# Patient Record
Sex: Female | Born: 1950 | Race: Asian | Hispanic: No | Marital: Single | Smoking: Never smoker
Health system: Southern US, Community
[De-identification: ages and names within clinical notes are randomized; demographics above are authoritative.]

## PROBLEM LIST (undated history)

## (undated) DIAGNOSIS — I10 Essential (primary) hypertension: Secondary | ICD-10-CM

## (undated) DIAGNOSIS — M1990 Unspecified osteoarthritis, unspecified site: Secondary | ICD-10-CM

---

## 2015-10-14 ENCOUNTER — Emergency Department (HOSPITAL_COMMUNITY): Payer: BLUE CROSS/BLUE SHIELD

## 2015-10-14 ENCOUNTER — Emergency Department (HOSPITAL_COMMUNITY)
Admission: EM | Admit: 2015-10-14 | Discharge: 2015-10-14 | Disposition: A | Discharge status: Home / Self Care | Payer: BLUE CROSS/BLUE SHIELD | Attending: Emergency Medicine | Admitting: Emergency Medicine

## 2015-10-14 ENCOUNTER — Encounter (HOSPITAL_COMMUNITY): Payer: Self-pay | Admitting: Emergency Medicine

## 2015-10-14 DIAGNOSIS — R51 Headache: Secondary | ICD-10-CM | POA: Insufficient documentation

## 2015-10-14 DIAGNOSIS — I10 Essential (primary) hypertension: Secondary | ICD-10-CM | POA: Diagnosis not present

## 2015-10-14 DIAGNOSIS — R0602 Shortness of breath: Secondary | ICD-10-CM | POA: Insufficient documentation

## 2015-10-14 DIAGNOSIS — Z8739 Personal history of other diseases of the musculoskeletal system and connective tissue: Secondary | ICD-10-CM | POA: Insufficient documentation

## 2015-10-14 DIAGNOSIS — R531 Weakness: Secondary | ICD-10-CM | POA: Insufficient documentation

## 2015-10-14 DIAGNOSIS — R519 Headache, unspecified: Secondary | ICD-10-CM

## 2015-10-14 DIAGNOSIS — R002 Palpitations: Secondary | ICD-10-CM | POA: Insufficient documentation

## 2015-10-14 DIAGNOSIS — R079 Chest pain, unspecified: Secondary | ICD-10-CM | POA: Diagnosis not present

## 2015-10-14 HISTORY — DX: Unspecified osteoarthritis, unspecified site: M19.90

## 2015-10-14 HISTORY — DX: Essential (primary) hypertension: I10

## 2015-10-14 LAB — BASIC METABOLIC PANEL
Anion gap: 6 (ref 5–15)
BUN: 11 mg/dL (ref 6–20)
CALCIUM: 9.2 mg/dL (ref 8.9–10.3)
CO2: 26 mmol/L (ref 22–32)
CREATININE: 0.7 mg/dL (ref 0.44–1.00)
Chloride: 106 mmol/L (ref 101–111)
GFR calc non Af Amer: >60 mL/min (ref >60)
Glucose, Bld: 160 mg/dL — ABNORMAL HIGH (ref 65–99)
Potassium: 3.9 mmol/L (ref 3.5–5.1)
SODIUM: 138 mmol/L (ref 135–145)

## 2015-10-14 LAB — CBC
HCT: 38.6 % (ref 36.0–46.0)
Hemoglobin: 12.5 g/dL (ref 12.0–15.0)
MCH: 25 pg — ABNORMAL LOW (ref 26.0–34.0)
MCHC: 32.4 g/dL (ref 30.0–36.0)
MCV: 77.2 fL — AB (ref 78.0–100.0)
PLATELETS: 370 10*3/uL (ref 150–400)
RBC: 5 MIL/uL (ref 3.87–5.11)
RDW: 15 % (ref 11.5–15.5)
WBC: 9.7 10*3/uL (ref 4.0–10.5)

## 2015-10-14 LAB — I-STAT TROPONIN, ED: TROPONIN I, POC: 0 ng/mL (ref 0.00–0.08)

## 2015-10-14 NOTE — ED Notes (Signed)
Pt brought to ED by [facility] from home for c/o 5/10 left side cp and feeling heart raising, SOB, dizziness, HA on right side and generalized weakness, last seen well by son yesterday night. Pt is taking ODC medication for cold, SR on [facility] monitor, BP 151/85, HR 95, R 19, 95% RA. 324 mg ASA and 2 nitro sl given by [facility] PTA, pt is vietnamese speaker, son at the bedside helping to translate per pt request.

## 2015-10-14 NOTE — ED Provider Notes (Addendum)
By signing my name below, I, Freida Busman, attest that this documentation has been prepared under the direction and in the presence of Kristen N Ward, DO . Electronically Signed: Freida Busman, Scribe. 10/14/2015. 3:52 AM.  TIME SEEN: 3:45 AM  CHIEF COMPLAINT:  Chief Complaint  Patient presents with  . Chest Pain  . Headache  . Weakness    HPI:  HPI Comments:  Chelsea Schaefer is a 65 y.o. female with a history of HTN, who presents to the Emergency Department via EMS complaining of constant left sided and central CP which began yesterday (10/13/15) AM and worsened ~ 1900/2000 last night . Pt reports associated elevated BP with systolic pressure of190  Palpitations, and SOB. Her symptoms have resolved at this time. Son pt is on HTN meds but takes them when her BP is elevated. She denies nausea, vomiting, diarrhea, fever, and cough. She also denies h/o DVT/PE and smoking hx. No alleviating factors noted.Pain was not exertional or pleuritic. No diaphoresis. Pt is not a native [place] speaker; hx translated/obtained from son.   ROS: See HPI Constitutional: no fever  Eyes: no drainage  ENT: no runny nose   Cardiovascular:  chest pain  Resp:  SOB  GI: no vomiting GU: no dysuria Integumentary: no rash  Allergy: no hives  Musculoskeletal: no leg swelling  Neurological: no slurred speech ROS otherwise negative  PAST MEDICAL HISTORY/PAST SURGICAL HISTORY:  Past Medical History  Diagnosis Date  . Hypertension   . Arthritis     MEDICATIONS:  Prior to Admission medications   Not on File    ALLERGIES:  No Known Allergies  SOCIAL HISTORY:  Social History  Substance Use Topics  . Smoking status: Never Smoker   . Smokeless tobacco: Not on file  . Alcohol Use: No    FAMILY HISTORY: History reviewed. No pertinent family history.  EXAM: BP 131/76 mmHg  Pulse 82  Temp(Src) 98.1 F (36.7 C) (Oral)  Resp 16  Ht  (1.575 m)  Wt 140 lb (63.504 kg)  BMI 25.60 kg/m2  SpO2  96% CONSTITUTIONAL: Alert and oriented and responds appropriately to questions. Well-appearing; well-nourished HEAD: Normocephalic EYES: Conjunctivae clear, PERRL ENT: normal nose; no rhinorrhea; moist mucous membranes NECK: Supple, no meningismus, no LAD; no meningismus CARD: RRR; S1 and S2 appreciated; no murmurs, no clicks, no rubs, no gallops CHEST: Non-tender without crepitus, ecchymosis, or deformity RESP: Normal chest excursion without splinting or tachypnea; breath sounds clear and equal bilaterally; no wheezes, no rhonchi, no rales, no hypoxia or respiratory distress, speaking full sentences ABD/GI: Normal bowel sounds; non-distended; soft, non-tender, no rebound, no guarding, no peritoneal signs BACK:  The back appears normal and is non-tender to palpation, there is no CVA tenderness EXT: Normal ROM in all joints; non-tender to palpation; no edema; normal capillary refill; no cyanosis, no calf tenderness or swelling    SKIN: Normal color for age and race; warm; no rash NEURO: Moves all extremities equally, sensation to light touch intact diffusely, cranial nerves II through XII intact PSYCH: The patient's mood and manner are appropriate. Grooming and personal hygiene are appropriate.   EKG Interpretation  Date/Time:  Wednesday October 14 2015 00:10:27 EDT Ventricular Rate:  87 PR Interval:  169 QRS Duration: 81 QT Interval:  392 QTC Calculation: 472 R Axis:   4 Text Interpretation:  Sinus rhythm Left atrial enlargement Left ventricular hypertrophy Borderline T abnormalities, inferior leads No old tracing to compare Confirmed by WARD,  DO, KRISTEN [phone]) on 10/14/2015  1:12:24 AM       MEDICAL DECISION MAKING: Patient here with chest pain and shortness of breath that started yesterday morning and was constant throughout the day but is now gone. Son reports she was hypertensive during this episode and took one of her blood pressure tablets is now feeling better. No history of  stress test or cardiac catheterization. No history of PE or DVT. Labs have been unremarkable.  Troponin obtained at 2 AM is negative. Given symptoms were constant for several hours I do not feel she needs serial enzymes. Otherwise labs are unremarkable. Chest x-ray clear. EKG shows no ST elevation. She does have nonspecific ST changes in inferior leads with no old for comparison. We'll provide PCP as well as cardiology information. Discussed return precautions. I feel she is low risk enough that she can be discharged home. Her heart score is 293 (age, history of hypertension, nonspecific T-wave changes) with risk of MACE 0.9-1.7%.  Have recommended close outpatient follow-up. Patient and son are comfortable with this plan.     Doubt dissection or pulmonary embolus given she is pain-free and asymptomatic currently.   Son does report the patient did complain of pain around her eyes. No severe headache. This is now also completely resolved. No numbness, tingling or focal weakness. No vision changes. Doubt infectious etiology or intracranial hemorrhage. Do not feel she needs emergent imaging of her head.   I personally performed the services described in this documentation, which was scribed in my presence. The recorded information has been reviewed and is accurate.    Layla MawKristen N Ward, DO 10/14/15 0400  Kristen N Ward, DO 10/14/15 0400  Layla MawKristen N Ward, DO 10/14/15 [id number]

## 2015-10-14 NOTE — Discharge Instructions (Signed)
General Headache Without Cause °A headache is pain or discomfort felt around the head or neck area. The specific cause of a headache may not be found. There are many causes and types of headaches. A few common ones are: °· Tension headaches. °· Migraine headaches. °· Cluster headaches. °· Chronic daily headaches. °HOME CARE INSTRUCTIONS  °Watch your condition for any changes. Take these steps to help with your condition: °Managing Pain °· Take over-the-counter and prescription medicines only as told by your health care provider. °· Lie down in a dark, quiet room when you have a headache. °· If directed, apply ice to the head and neck area: °· Put ice in a plastic bag. °· Place a towel between your skin and the bag. °· Leave the ice on for 20 minutes, 2-3 times per day. °· Use a heating pad or hot shower to apply heat to the head and neck area as told by your health care provider. °· Keep lights dim if bright lights bother you or make your headaches worse. °Eating and Drinking °· Eat meals on a regular schedule. °· Limit alcohol use. °· Decrease the amount of caffeine you drink, or stop drinking caffeine. °General Instructions °· Keep all follow-up visits as told by your health care provider. This is important. °· Keep a headache journal to help find out what may trigger your headaches. For example, write down: °· What you eat and drink. °· How much sleep you get. °· Any change to your diet or medicines. °· Try massage or other relaxation techniques. °· Limit stress. °· Sit up straight, and do not tense your muscles. °· Do not use tobacco products, including cigarettes, chewing tobacco, or e-cigarettes. If you need help quitting, ask your health care provider. °· Exercise regularly as told by your health care provider. °· Sleep on a regular schedule. Get 7-9 hours of sleep, or the amount recommended by your health care provider. °SEEK MEDICAL CARE IF:  °· Your symptoms are not helped by medicine. °· You have a  headache that is different from the usual headache. °· You have nausea or you vomit. °· You have a fever. °SEEK IMMEDIATE MEDICAL CARE IF:  °· Your headache becomes severe. °· You have repeated vomiting. °· You have a stiff neck. °· You have a loss of vision. °· You have problems with speech. °· You have pain in the eye or ear. °· You have muscular weakness or loss of muscle control. °· You lose your balance or have trouble walking. °· You feel faint or pass out. °· You have confusion. °  °This information is not intended to replace advice given to you by your health care provider. Make sure you discuss any questions you have with your health care provider. °  °Document Released: 07/11/2005 Document Revised: 04/01/2015 Document Reviewed: 11/03/2014 °Elsevier Interactive Patient Education ©2016 [employer] °Hypertension °Hypertension, commonly called high blood pressure, is when the force of blood pumping through your arteries is too strong. Your arteries are the blood vessels that carry blood from your heart throughout your body. A blood pressure reading consists of a higher number over a lower number, such as 110/72. The higher number (systolic) is the pressure inside your arteries when your heart pumps. The lower number (diastolic) is the pressure inside your arteries when your heart relaxes. Ideally you want your blood pressure below 120/80. °Hypertension forces your heart to work harder to pump blood. Your arteries may become narrow or stiff. Having untreated or uncontrolled   hypertension can cause heart attack, stroke, kidney disease, and other problems. °RISK FACTORS °Some risk factors for high blood pressure are controllable. Others are not.  °Risk factors you cannot control include:  °· Race. You may be at higher risk if you are African American. °· Age. Risk increases with age. °· Gender. Men are at higher risk than women before age 45 years. After age 65, women are at higher risk than men. °Risk factors  you can control include: °· Not getting enough exercise or physical activity. °· Being overweight. °· Getting too much fat, sugar, calories, or salt in your diet. °· Drinking too much alcohol. °SIGNS AND SYMPTOMS °Hypertension does not usually cause signs or symptoms. Extremely high blood pressure (hypertensive crisis) may cause headache, anxiety, shortness of breath, and nosebleed. °DIAGNOSIS °To check if you have hypertension, your health care provider will measure your blood pressure while you are seated, with your arm held at the level of your heart. It should be measured at least twice using the same arm. Certain conditions can cause a difference in blood pressure between your right and left arms. A blood pressure reading that is higher than normal on one occasion does not mean that you need treatment. If it is not clear whether you have high blood pressure, you may be asked to return on a different day to have your blood pressure checked again. Or, you may be asked to monitor your blood pressure at home for 1 or more weeks. °TREATMENT °Treating high blood pressure includes making lifestyle changes and possibly taking medicine. Living a healthy lifestyle can help lower high blood pressure. You may need to change some of your habits. °Lifestyle changes may include: °· Following the DASH diet. This diet is high in fruits, vegetables, and whole grains. It is low in salt, red meat, and added sugars. °· Keep your sodium intake below 2,300 mg per day. °· Getting at least 30-45 minutes of aerobic exercise at least 4 times per week. °· Losing weight if necessary. °· Not smoking. °· Limiting alcoholic beverages. °· Learning ways to reduce stress. °Your health care provider may prescribe medicine if lifestyle changes are not enough to get your blood pressure under control, and if one of the following is true: °· You are 18-59 years of age and your systolic blood pressure is above 140. °· You are 60 years of age or older,  and your systolic blood pressure is above 150. °· Your diastolic blood pressure is above 90. °· You have diabetes, and your systolic blood pressure is over 140 or your diastolic blood pressure is over 90. °· You have kidney disease and your blood pressure is above 140/90. °· You have heart disease and your blood pressure is above 140/90. °Your personal target blood pressure may vary depending on your medical conditions, your age, and other factors. °HOME CARE INSTRUCTIONS °· Have your blood pressure rechecked as directed by your health care provider.   °· Take medicines only as directed by your health care provider. Follow the directions carefully. Blood pressure medicines must be taken as prescribed. The medicine does not work as well when you skip doses. Skipping doses also puts you at risk for problems. °· Do not smoke.   °· Monitor your blood pressure at home as directed by your health care provider.  °SEEK MEDICAL CARE IF:  °· You think you are having a reaction to medicines taken. °· You have recurrent headaches or feel dizzy. °· You have swelling in your ankles. °·   You have trouble with your vision. °SEEK IMMEDIATE MEDICAL CARE IF: °· You develop a severe headache or confusion. °· You have unusual weakness, numbness, or feel faint. °· You have severe chest or abdominal pain. °· You vomit repeatedly. °· You have trouble breathing. °MAKE SURE YOU:  °· Understand these instructions. °· Will watch your condition. °· Will get help right away if you are not doing well or get worse. °  °This information is not intended to replace advice given to you by your health care provider. Make sure you discuss any questions you have with your health care provider. °  °Document Released: 07/11/2005 Document Revised: 11/25/2014 Document Reviewed: 05/03/2013 °Elsevier Interactive Patient Education ©2016 [employer] Inc[employer] °Nonspecific Chest Pain  °Chest pain can be caused by many different conditions. There is always a chance that  your pain could be related to something serious, such as a heart attack or a blood clot in your lungs. Chest pain can also be caused by conditions that are not life-threatening. If you have chest pain, it is very important to follow up with your health care provider. °CAUSES  °Chest pain can be caused by: °· Heartburn. °· Pneumonia or bronchitis. °· Anxiety or stress. °· Inflammation around your heart (pericarditis) or lung (pleuritis or pleurisy). °· A blood clot in your lung. °· A collapsed lung (pneumothorax). It can develop suddenly on its own (spontaneous pneumothorax) or from trauma to the chest. °· Shingles infection (varicella-zoster virus). °· Heart attack. °· Damage to the bones, muscles, and cartilage that make up your chest wall. This can include: °¨ Bruised bones due to injury. °¨ Strained muscles or cartilage due to frequent or repeated coughing or overwork. °¨ Fracture to one or more ribs. °¨ Sore cartilage due to inflammation (costochondritis). °RISK FACTORS  °Risk factors for chest pain may include: °· Activities that increase your risk for trauma or injury to your chest. °· Respiratory infections or conditions that cause frequent coughing. °· Medical conditions or overeating that can cause heartburn. °· Heart disease or family history of heart disease. °· Conditions or health behaviors that increase your risk of developing a blood clot. °· Having had chicken pox (varicella zoster). °SIGNS AND SYMPTOMS °Chest pain can feel like: °· Burning or tingling on the surface of your chest or deep in your chest. °· Crushing, pressure, aching, or squeezing pain. °· Dull or sharp pain that is worse when you move, cough, or take a deep breath. °· Pain that is also felt in your back, neck, shoulder, or arm, or pain that spreads to any of these areas. °Your chest pain may come and go, or it may stay constant. °DIAGNOSIS °Lab tests or other studies may be needed to find the cause of your pain. Your health care  provider may have you take a test called an ambulatory ECG (electrocardiogram). An ECG records your heartbeat patterns at the time the test is performed. You may also have other tests, such as: °· Transthoracic echocardiogram (TTE). During echocardiography, sound waves are used to create a picture of all of the heart structures and to look at how blood flows through your heart. °· Transesophageal echocardiogram (TEE). This is a more advanced imaging test that obtains images from inside your body. It allows your health care provider to see your heart in finer detail. °· Cardiac monitoring. This allows your health care provider to monitor your heart rate and rhythm in real time. °· Holter monitor. This is a portable device that records your   heartbeat and can help to diagnose abnormal heartbeats. It allows your health care provider to track your heart activity for several days, if needed. °· Stress tests. These can be done through exercise or by taking medicine that makes your heart beat more quickly. °· Blood tests. °· Imaging tests. °TREATMENT  °Your treatment depends on what is causing your chest pain. Treatment may include: °· Medicines. These may include: °¨ Acid blockers for heartburn. °¨ Anti-inflammatory medicine. °¨ Pain medicine for inflammatory conditions. °¨ Antibiotic medicine, if an infection is present. °¨ Medicines to dissolve blood clots. °¨ Medicines to treat coronary artery disease. °· Supportive care for conditions that do not require medicines. This may include: °¨ Resting. °¨ Applying heat or cold packs to injured areas. °¨ Limiting activities until pain decreases. °HOME CARE INSTRUCTIONS °· If you were prescribed an antibiotic medicine, finish it all even if you start to feel better. °· Avoid any activities that bring on chest pain. °· Do not use any tobacco products, including cigarettes, chewing tobacco, or electronic cigarettes. If you need help quitting, ask your health care provider. °· Do  not drink alcohol. °· Take medicines only as directed by your health care provider. °· Keep all follow-up visits as directed by your health care provider. This is important. This includes any further testing if your chest pain does not go away. °· If heartburn is the cause for your chest pain, you may be told to keep your head raised (elevated) while sleeping. This reduces the chance that acid will go from your stomach into your esophagus. °· Make lifestyle changes as directed by your health care provider. These may include: °¨ Getting regular exercise. Ask your health care provider to suggest some activities that are safe for you. °¨ Eating a heart-healthy diet. A registered dietitian can help you to learn healthy eating options. °¨ Maintaining a healthy weight. °¨ Managing diabetes, if necessary. °¨ Reducing stress. °SEEK MEDICAL CARE IF: °· Your chest pain does not go away after treatment. °· You have a rash with blisters on your chest. °· You have a fever. °SEEK IMMEDIATE MEDICAL CARE IF:  °· Your chest pain is worse. °· You have an increasing cough, or you cough up blood. °· You have severe abdominal pain. °· You have severe weakness. °· You faint. °· You have chills. °· You have sudden, unexplained chest discomfort. °· You have sudden, unexplained discomfort in your arms, back, neck, or jaw. °· You have shortness of breath at any time. °· You suddenly start to sweat, or your skin gets clammy. °· You feel nauseous or you vomit. °· You suddenly feel light-headed or dizzy. °· Your heart begins to beat quickly, or it feels like it is skipping beats. °These symptoms may represent a serious problem that is an emergency. Do not wait to see if the symptoms will go away. Get medical help right away. Call your local emergency services (911 in the [place] Do not drive yourself to the hospital. °  °This information is not intended to replace advice given to you by your health care provider. Make sure you discuss any  questions you have with your health care provider. °  °Document Released: 04/20/2005 Document Revised: 08/01/2014 Document Reviewed: 02/14/2014 °Elsevier Interactive Patient Education ©2016 [employer] Inc. ° °

## 2016-09-16 ENCOUNTER — Inpatient Hospital Stay (HOSPITAL_COMMUNITY)
Admission: EM | Admit: 2016-09-16 | Discharge: 2016-10-23 | DRG: 064 | Disposition: E | Payer: Medicare Other | Attending: Internal Medicine | Admitting: Internal Medicine

## 2016-09-16 ENCOUNTER — Encounter (HOSPITAL_COMMUNITY): Payer: Self-pay | Admitting: Emergency Medicine

## 2016-09-16 ENCOUNTER — Emergency Department (HOSPITAL_COMMUNITY): Payer: Medicare Other

## 2016-09-16 ENCOUNTER — Inpatient Hospital Stay (HOSPITAL_COMMUNITY): Payer: Medicare Other

## 2016-09-16 DIAGNOSIS — G9349 Other encephalopathy: Secondary | ICD-10-CM | POA: Diagnosis present

## 2016-09-16 DIAGNOSIS — R293 Abnormal posture: Secondary | ICD-10-CM

## 2016-09-16 DIAGNOSIS — I6789 Other cerebrovascular disease: Secondary | ICD-10-CM | POA: Diagnosis not present

## 2016-09-16 DIAGNOSIS — I959 Hypotension, unspecified: Secondary | ICD-10-CM | POA: Diagnosis not present

## 2016-09-16 DIAGNOSIS — Z515 Encounter for palliative care: Secondary | ICD-10-CM | POA: Diagnosis not present

## 2016-09-16 DIAGNOSIS — G253 Myoclonus: Secondary | ICD-10-CM | POA: Diagnosis not present

## 2016-09-16 DIAGNOSIS — Z9289 Personal history of other medical treatment: Secondary | ICD-10-CM

## 2016-09-16 DIAGNOSIS — R402113 Coma scale, eyes open, never, at hospital admission: Secondary | ICD-10-CM | POA: Diagnosis not present

## 2016-09-16 DIAGNOSIS — J690 Pneumonitis due to inhalation of food and vomit: Secondary | ICD-10-CM | POA: Diagnosis not present

## 2016-09-16 DIAGNOSIS — I10 Essential (primary) hypertension: Secondary | ICD-10-CM | POA: Diagnosis not present

## 2016-09-16 DIAGNOSIS — R402122 Coma scale, eyes open, to pain, at arrival to emergency department: Secondary | ICD-10-CM | POA: Diagnosis present

## 2016-09-16 DIAGNOSIS — I613 Nontraumatic intracerebral hemorrhage in brain stem: Secondary | ICD-10-CM | POA: Diagnosis present

## 2016-09-16 DIAGNOSIS — R739 Hyperglycemia, unspecified: Secondary | ICD-10-CM | POA: Diagnosis not present

## 2016-09-16 DIAGNOSIS — R402431 Glasgow coma scale score 3-8, in the field [EMT or ambulance]: Secondary | ICD-10-CM | POA: Diagnosis not present

## 2016-09-16 DIAGNOSIS — E876 Hypokalemia: Secondary | ICD-10-CM | POA: Diagnosis present

## 2016-09-16 DIAGNOSIS — R29729 NIHSS score 29: Secondary | ICD-10-CM | POA: Diagnosis not present

## 2016-09-16 DIAGNOSIS — J9600 Acute respiratory failure, unspecified whether with hypoxia or hypercapnia: Secondary | ICD-10-CM

## 2016-09-16 DIAGNOSIS — Y95 Nosocomial condition: Secondary | ICD-10-CM | POA: Diagnosis not present

## 2016-09-16 DIAGNOSIS — R403 Persistent vegetative state: Secondary | ICD-10-CM | POA: Diagnosis not present

## 2016-09-16 DIAGNOSIS — E785 Hyperlipidemia, unspecified: Secondary | ICD-10-CM | POA: Diagnosis present

## 2016-09-16 DIAGNOSIS — K5900 Constipation, unspecified: Secondary | ICD-10-CM | POA: Diagnosis not present

## 2016-09-16 DIAGNOSIS — R402342 Coma scale, best motor response, flexion withdrawal, at arrival to emergency department: Secondary | ICD-10-CM | POA: Diagnosis present

## 2016-09-16 DIAGNOSIS — M1990 Unspecified osteoarthritis, unspecified site: Secondary | ICD-10-CM | POA: Diagnosis not present

## 2016-09-16 DIAGNOSIS — I619 Nontraumatic intracerebral hemorrhage, unspecified: Secondary | ICD-10-CM

## 2016-09-16 DIAGNOSIS — R402212 Coma scale, best verbal response, none, at arrival to emergency department: Secondary | ICD-10-CM | POA: Diagnosis present

## 2016-09-16 DIAGNOSIS — J9690 Respiratory failure, unspecified, unspecified whether with hypoxia or hypercapnia: Secondary | ICD-10-CM

## 2016-09-16 DIAGNOSIS — J9811 Atelectasis: Secondary | ICD-10-CM | POA: Diagnosis present

## 2016-09-16 DIAGNOSIS — J9601 Acute respiratory failure with hypoxia: Secondary | ICD-10-CM | POA: Diagnosis present

## 2016-09-16 DIAGNOSIS — Z978 Presence of other specified devices: Secondary | ICD-10-CM

## 2016-09-16 DIAGNOSIS — I161 Hypertensive emergency: Secondary | ICD-10-CM | POA: Diagnosis present

## 2016-09-16 DIAGNOSIS — R6510 Systemic inflammatory response syndrome (SIRS) of non-infectious origin without acute organ dysfunction: Secondary | ICD-10-CM | POA: Diagnosis not present

## 2016-09-16 DIAGNOSIS — R509 Fever, unspecified: Secondary | ICD-10-CM

## 2016-09-16 DIAGNOSIS — D72829 Elevated white blood cell count, unspecified: Secondary | ICD-10-CM

## 2016-09-16 DIAGNOSIS — Z66 Do not resuscitate: Secondary | ICD-10-CM | POA: Diagnosis not present

## 2016-09-16 LAB — DIFFERENTIAL
Basophils Absolute: 0 10*3/uL (ref 0.0–0.1)
Basophils Relative: 0 %
Eosinophils Absolute: 0 10*3/uL (ref 0.0–0.7)
Eosinophils Relative: 0 %
Lymphocytes Relative: 56 %
Lymphs Abs: 9.7 10*3/uL — ABNORMAL HIGH (ref 0.7–4.0)
Monocytes Absolute: 0.7 10*3/uL (ref 0.1–1.0)
Monocytes Relative: 4 %
Neutro Abs: 6.9 10*3/uL (ref 1.7–7.7)
Neutrophils Relative %: 40 %

## 2016-09-16 LAB — COMPREHENSIVE METABOLIC PANEL
ALT: 23 U/L (ref 14–54)
AST: 26 U/L (ref 15–41)
Albumin: 3.6 g/dL (ref 3.5–5.0)
Alkaline Phosphatase: 62 U/L (ref 38–126)
Anion gap: 14 (ref 5–15)
BUN: 14 mg/dL (ref 6–20)
CO2: 20 mmol/L — ABNORMAL LOW (ref 22–32)
Calcium: 9.6 mg/dL (ref 8.9–10.3)
Chloride: 105 mmol/L (ref 101–111)
Creatinine, Ser: 0.71 mg/dL (ref 0.44–1.00)
GFR calc Af Amer: >60 mL/min (ref >60)
GFR calc non Af Amer: >60 mL/min (ref >60)
Glucose, Bld: 138 mg/dL — ABNORMAL HIGH (ref 65–99)
Potassium: 4.4 mmol/L (ref 3.5–5.1)
Sodium: 139 mmol/L (ref 135–145)
Total Bilirubin: 0.7 mg/dL (ref 0.3–1.2)
Total Protein: 7.7 g/dL (ref 6.5–8.1)

## 2016-09-16 LAB — RAPID URINE DRUG SCREEN, HOSP PERFORMED
AMPHETAMINES: NOT DETECTED
Barbiturates: NOT DETECTED
Benzodiazepines: NOT DETECTED
Cocaine: NOT DETECTED
Opiates: NOT DETECTED
TETRAHYDROCANNABINOL: NOT DETECTED

## 2016-09-16 LAB — I-STAT CHEM 8, ED
BUN: 17 mg/dL (ref 6–20)
Calcium, Ion: 1.07 mmol/L — ABNORMAL LOW (ref 1.15–1.40)
Chloride: 108 mmol/L (ref 101–111)
Creatinine, Ser: 0.7 mg/dL (ref 0.44–1.00)
Glucose, Bld: 141 mg/dL — ABNORMAL HIGH (ref 65–99)
HCT: 45 % (ref 36.0–46.0)
Hemoglobin: 15.3 g/dL — ABNORMAL HIGH (ref 12.0–15.0)
Potassium: 4.3 mmol/L (ref 3.5–5.1)
Sodium: 141 mmol/L (ref 135–145)
TCO2: 26 mmol/L (ref 0–100)

## 2016-09-16 LAB — POCT I-STAT 3, ART BLOOD GAS (G3+)
ACID-BASE DEFICIT: 1 mmol/L (ref 0.0–2.0)
Bicarbonate: 23.1 mmol/L (ref 20.0–28.0)
O2 Saturation: 98 %
PH ART: 7.409 (ref 7.350–7.450)
Patient temperature: 98.6
TCO2: 24 mmol/L (ref 0–100)
pCO2 arterial: 36.5 mmHg (ref 32.0–48.0)
pO2, Arterial: 102 mmHg (ref 83.0–108.0)

## 2016-09-16 LAB — I-STAT TROPONIN, ED: Troponin i, poc: 0 ng/mL (ref 0.00–0.08)

## 2016-09-16 LAB — CBC
HCT: 42.1 % (ref 36.0–46.0)
Hemoglobin: 13.7 g/dL (ref 12.0–15.0)
MCH: 25.5 pg — ABNORMAL LOW (ref 26.0–34.0)
MCHC: 32.5 g/dL (ref 30.0–36.0)
MCV: 78.3 fL (ref 78.0–100.0)
Platelets: 358 10*3/uL (ref 150–400)
RBC: 5.38 MIL/uL — ABNORMAL HIGH (ref 3.87–5.11)
RDW: 15.3 % (ref 11.5–15.5)
WBC: 17.3 10*3/uL — ABNORMAL HIGH (ref 4.0–10.5)

## 2016-09-16 LAB — MRSA PCR SCREENING: MRSA BY PCR: NEGATIVE

## 2016-09-16 LAB — PROTIME-INR
INR: 0.98
INR: 0.95
PROTHROMBIN TIME: 12.7 s (ref 11.4–15.2)
Prothrombin Time: 13 seconds (ref 11.4–15.2)

## 2016-09-16 LAB — LIPID PANEL
CHOL/HDL RATIO: 8.7 ratio
Cholesterol: 323 mg/dL — ABNORMAL HIGH (ref 0–200)
HDL: 37 mg/dL — ABNORMAL LOW (ref >40)
LDL CALC: 241 mg/dL — AB (ref 0–99)
TRIGLYCERIDES: 226 mg/dL — AB (ref <150)
VLDL: 45 mg/dL — ABNORMAL HIGH (ref 0–40)

## 2016-09-16 LAB — TRIGLYCERIDES: Triglycerides: 227 mg/dL — ABNORMAL HIGH (ref <150)

## 2016-09-16 LAB — APTT
aPTT: 27 seconds (ref 24–36)
aPTT: 22 seconds — ABNORMAL LOW (ref 24–36)

## 2016-09-16 LAB — CBG MONITORING, ED: Glucose-Capillary: 138 mg/dL — ABNORMAL HIGH (ref 65–99)

## 2016-09-16 MED ORDER — CLEVIDIPINE BUTYRATE 0.5 MG/ML IV EMUL: 0.0000 mg/h | INTRAVENOUS | Status: DC

## 2016-09-16 MED ORDER — CLEVIDIPINE BUTYRATE 0.5 MG/ML IV EMUL
0.0000 mg/h | INTRAVENOUS | Status: DC
  Administered 2016-09-16: 13 mg/h via INTRAVENOUS
  Administered 2016-09-16: 16 mg/h via INTRAVENOUS
  Administered 2016-09-16: 3 mg/h via INTRAVENOUS
  Administered 2016-09-17: 18 mg/h via INTRAVENOUS
  Administered 2016-09-17 (×3): 9 mg/h via INTRAVENOUS
  Administered 2016-09-17: 18 mg/h via INTRAVENOUS
  Administered 2016-09-17: 14 mg/h via INTRAVENOUS
  Administered 2016-09-17: 9 mg/h via INTRAVENOUS
  Administered 2016-09-17: 16 mg/h via INTRAVENOUS
  Administered 2016-09-17: 14 mg/h via INTRAVENOUS
  Administered 2016-09-17: 18 mg/h via INTRAVENOUS
  Administered 2016-09-18 (×2): 8 mg/h via INTRAVENOUS
  Administered 2016-09-18 – 2016-09-19 (×2): 4 mg/h via INTRAVENOUS
  Administered 2016-09-19: 7 mg/h via INTRAVENOUS
  Administered 2016-09-19 (×2): 6 mg/h via INTRAVENOUS
  Administered 2016-09-20: 10 mg/h via INTRAVENOUS
  Administered 2016-09-20: 18 mg/h via INTRAVENOUS
  Administered 2016-09-20: 14 mg/h via INTRAVENOUS
  Administered 2016-09-20: 18 mg/h via INTRAVENOUS
  Administered 2016-09-20: 8 mg/h via INTRAVENOUS
  Filled 2016-09-16: qty 50
  Filled 2016-09-16: qty 100
  Filled 2016-09-16 (×11): qty 50
  Filled 2016-09-16: qty 100
  Filled 2016-09-16 (×9): qty 50

## 2016-09-16 MED ORDER — PANTOPRAZOLE SODIUM 40 MG IV SOLR
40.0000 mg | Freq: Every day | INTRAVENOUS | Status: DC
  Administered 2016-09-16: 40 mg via INTRAVENOUS
  Filled 2016-09-16: qty 40

## 2016-09-16 MED ORDER — PROPOFOL 1000 MG/100ML IV EMUL
INTRAVENOUS | Status: AC
  Filled 2016-09-16: qty 100

## 2016-09-16 MED ORDER — ACETAMINOPHEN 160 MG/5ML PO SOLN
650.0000 mg | ORAL | Status: DC | PRN
  Administered 2016-09-16 – 2016-09-24 (×21): 650 mg
  Filled 2016-09-16 (×21): qty 20.3

## 2016-09-16 MED ORDER — ORAL CARE MOUTH RINSE
15.0000 mL | Freq: Four times a day (QID) | OROMUCOSAL | Status: DC
  Administered 2016-09-16 – 2016-09-18 (×9): 15 mL via OROMUCOSAL

## 2016-09-16 MED ORDER — SODIUM CHLORIDE 0.9 % IV SOLN
INTRAVENOUS | Status: DC
  Administered 2016-09-16 – 2016-09-25 (×11): via INTRAVENOUS

## 2016-09-16 MED ORDER — CHLORHEXIDINE GLUCONATE 0.12% ORAL RINSE (MEDLINE KIT)
15.0000 mL | Freq: Two times a day (BID) | OROMUCOSAL | Status: DC
  Administered 2016-09-16 – 2016-09-26 (×20): 15 mL via OROMUCOSAL

## 2016-09-16 MED ORDER — FENTANYL CITRATE (PF) 100 MCG/2ML IJ SOLN: 50.0000 ug | INTRAMUSCULAR | Status: DC | PRN

## 2016-09-16 MED ORDER — PROPOFOL 1000 MG/100ML IV EMUL: 0.0000 ug/kg/min | INTRAVENOUS | Status: DC

## 2016-09-16 MED ORDER — FENTANYL CITRATE (PF) 100 MCG/2ML IJ SOLN
50.0000 ug | INTRAMUSCULAR | Status: DC | PRN
  Administered 2016-09-23 (×2): 50 ug via INTRAVENOUS
  Filled 2016-09-16 (×2): qty 2

## 2016-09-16 MED ORDER — ACETAMINOPHEN 650 MG RE SUPP
650.0000 mg | RECTAL | Status: DC | PRN
  Administered 2016-09-21: 650 mg via RECTAL
  Filled 2016-09-16: qty 1

## 2016-09-16 MED ORDER — STROKE: EARLY STAGES OF RECOVERY BOOK
Freq: Once | Status: AC
  Administered 2016-09-16: 15:00:00
  Filled 2016-09-16: qty 1

## 2016-09-16 MED ORDER — ROCURONIUM BROMIDE 50 MG/5ML IV SOLN
INTRAVENOUS | Status: DC | PRN
  Administered 2016-09-16: 60 mg via INTRAVENOUS

## 2016-09-16 MED ORDER — ACETAMINOPHEN 325 MG PO TABS
650.0000 mg | ORAL_TABLET | ORAL | Status: DC | PRN
  Administered 2016-09-21 – 2016-09-23 (×4): 650 mg via ORAL
  Filled 2016-09-16 (×5): qty 2

## 2016-09-16 MED ORDER — SENNOSIDES-DOCUSATE SODIUM 8.6-50 MG PO TABS
1.0000 | ORAL_TABLET | Freq: Two times a day (BID) | ORAL | Status: DC
  Administered 2016-09-16 – 2016-09-21 (×10): 1 via ORAL
  Filled 2016-09-16 (×11): qty 1

## 2016-09-16 MED ORDER — ETOMIDATE 2 MG/ML IV SOLN
INTRAVENOUS | Status: DC | PRN
  Administered 2016-09-16: 20 mg via INTRAVENOUS

## 2016-09-16 MED ORDER — CLEVIDIPINE BUTYRATE 0.5 MG/ML IV EMUL
0.0000 mg/h | INTRAVENOUS | Status: DC
  Administered 2016-09-16: 2 mg/h via INTRAVENOUS

## 2016-09-16 MED ORDER — PROPOFOL 1000 MG/100ML IV EMUL
0.0000 ug/kg/min | INTRAVENOUS | Status: DC
  Administered 2016-09-16: 5 ug/kg/min via INTRAVENOUS

## 2016-09-16 NOTE — Progress Notes (Signed)
Patient's son approached Stroke Response RN and ED RN about patient's drivers license (DL) having a heart for organ donation on it. The son states the heart was supposed to be removed from her DL upon last renew, however d/t translation issues, the heart remained. Son and Husband both state they and the patient would NOT want organ donation. They further stated they plan to take her back to her home country after she passes.

## 2016-09-16 NOTE — Progress Notes (Signed)
eLink Physician-Brief Progress Note Patient Name: Donella Stadeu Thi Bartolini DOB: 29-Dec-1950 MRN: [id number]   Date of Service  2016/09/21  HPI/Events of Note  Fever - Temp = 102.3 F. S/p Tylenol dose.   eICU Interventions  Will order cooling blanket.      Intervention Category Intermediate Interventions: Other:  Lenell AntuSommer,Steven Eugene 2016/09/21, 8:13 PM

## 2016-09-16 NOTE — Progress Notes (Signed)
Nutrition Brief Note  Chart reviewed. Pt in coma with very poor prognosis.  No nutrition interventions warranted at this time.  Please consult as needed.   Maureen ChattersKatie Anuoluwapo Mefferd, RD, LDN Pager #: [phone] After-Hours Pager #: [phone]

## 2016-09-16 NOTE — ED Notes (Signed)
RT at bedside to pull back ETT 2 cm.

## 2016-09-16 NOTE — H&P (Signed)
PULMONARY / CRITICAL CARE MEDICINE   Name: Chelsea Schaefer MRN: [id number] DOB: 08-03-1950    ADMISSION DATE:  08/27/2016 CONSULTATION DATE:  2/23  REFERRING MD:  Juleen China   CHIEF COMPLAINT:  ICH   HISTORY OF PRESENT ILLNESS:   This is a 66 year old female w/ no sig history. Was in usual health until am of admit. Pt awoke around 0700 went to RR. About 0730 she was found unresponsive on the floor. On arrival to ER she would wd to pain. Was hypertensive w/ SBP in 170-180s. CT head + for pontine ICH. PCCM asked to admit.  PAST MEDICAL HISTORY :  She  has a past medical history of Arthritis and Hypertension.  PAST SURGICAL HISTORY: She  has no past surgical history on file.  No Known Allergies  No current facility-administered medications on file prior to encounter.    No current outpatient prescriptions on file prior to encounter.    FAMILY HISTORY:  Her has no family status information on file.    SOCIAL HISTORY: She  reports that she has never smoked. She has never used smokeless tobacco. She reports that she does not drink alcohol or use drugs.  REVIEW OF SYSTEMS:   Unable   SUBJECTIVE:  Unresponsive on vent   VITAL SIGNS: BP 140/87   Pulse 114   Temp 97.1 F (36.2 C)   Resp 25   Ht 5\' 4"  (1.626 m)   Wt 142 lb 10.2 oz (64.7 kg)   SpO2 100%   BMI 24.48 kg/m   HEMODYNAMICS:    VENTILATOR SETTINGS: Vent Mode: PRVC FiO2 (%):  [40 %] 40 % Set Rate:  [15 bmp] 15 bmp Vt Set:  [400 mL] 400 mL PEEP:  [5 cmH20] 5 cmH20 Plateau Pressure:  [18 cmH20] 18 cmH20  INTAKE / OUTPUT: No intake/output data recorded.  PHYSICAL EXAMINATION: General:  66 year old female, minimally responsive on vent  Neuro:  No gag or cough, corneal's are absent. Flaccid and does not move  HEENT:  Orally intubated. MMM, no JVD.  Cardiovascular:  RRR no MRG Lungs:  Clear w/ equal rise  Abdomen:  Soft, not tender  Musculoskeletal:  Equal bulk Skin:  Warm and dry  LABS:  BMET  Recent  Labs Lab 08/28/2016 0806 09/05/2016 0812  NA 139 141  K 4.4 4.3  CL 105 108  CO2 20*  --   BUN 14 17  CREATININE 0.71 0.70  GLUCOSE 138* 141*    Electrolytes  Recent Labs Lab 08/25/2016 0806  CALCIUM 9.6    CBC  Recent Labs Lab 09/08/2016 0806 09/17/2016 0812  WBC 17.3*  --   HGB 13.7 15.3*  HCT 42.1 45.0  PLT 358  --     Coag's  Recent Labs Lab 08/26/2016 0806  APTT 27  INR 0.98    Sepsis Markers No results for input(s): LATICACIDVEN, PROCALCITON, O2SATVEN in the last 168 hours.  ABG No results for input(s): PHART, PCO2ART, PO2ART in the last 168 hours.  Liver Enzymes  Recent Labs Lab 09/04/2016 0806  AST 26  ALT 23  ALKPHOS 62  BILITOT 0.7  ALBUMIN 3.6    Cardiac Enzymes No results for input(s): TROPONINI, PROBNP in the last 168 hours.  Glucose  Recent Labs Lab 09/01/2016 0828  GLUCAP 138*    Imaging Dg Chest Portable 1 View  Result Date: 08/26/2016 CLINICAL DATA:  Unconscious.  Endotracheal tube placement. EXAM: PORTABLE CHEST 1 VIEW COMPARISON:  10/14/2015 FINDINGS: Endotracheal tube  tip is at the orifice of the right mainstem bronchus. There is left basilar opacity with volume loss elevating the diaphragm and stomach. An orogastric tube reaches the stomach with tip at the fundus. The right lung is clear. Heart size is normal. These results were called by telephone at the time of interpretation on October 11, 2016 at 9:07 am to Dr. Raeford RazorSTEPHEN KOHUT , who verbally acknowledged these results. IMPRESSION: 1. Endotracheal tube tip at the right mainstem bronchus. 2. Left lower lobe atelectasis could be from #1 or aspiration. Electronically Signed   By: Marnee SpringJonathon  Watts M.D.   On: 0March 20, 2018 09:10   Ct Head Code Stroke W/o Cm  Result Date: October 11, 2016 CLINICAL DATA:  Code stroke. 66 year old female found unresponsive in the bathroom this morning. Initial encounter. EXAM: CT HEAD WITHOUT CONTRAST TECHNIQUE: Contiguous axial images were obtained from the base of the  skull through the vertex without intravenous contrast. COMPARISON:  None. FINDINGS: Brain: Oval hyperdense hemorrhage situated in the central to dorsal pons with early extension into the left cerebellar peduncle and dorsally toward the area postrema (series 21, image 8). The dominant portion of hemorrhage encompasses 14 x 36 x 20 mm foreign estimated blood volume of 5 mL. There is mild expansion of the pons. The basilar cisterns remain patent. There is no intraventricular or subarachnoid extension at this time. Patchy and confluent bilateral cerebral white matter hypodensity, including involvement of the deep white matter capsules and possibly also the right basal ganglia. No cortical encephalomalacia. No ventriculomegaly. No other acute intracranial hemorrhage. No superimposed acute cortically based infarct. Vascular: Extensive Calcified atherosclerosis at the skull base. No suspicious intracranial vascular hyperdensity (basilar artery density appears similar to the right ICA terminus series 21, image 11). Skull: No acute osseous abnormality identified. Sinuses/Orbits: Right nasal airway in place. Visualized paranasal sinuses and mastoids are well pneumatized. Other: Visualized orbits and scalp soft tissues are within normal limits. ASPECTS [facility]([place] Stroke Program Early CT Score) Total score (0-10 with 10 being normal): Not applicable, acute intracranial hemorrhage. IMPRESSION: 1. 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 Early extension into the left cerebellar peduncle and area postrema. No intraventricular or subarachnoid extension. Patent basilar cisterns. No significant intracranial mass effect at this time. 2. Underlying chronic small vessel disease. 3. ASPECTS is not applicable; acute hemorrhage. 4. The above was relayed via text pager to Dr. Bennett Scrape. Oster on October 11, 2016 at 08:26 . Electronically Signed   By: Odessa FlemingH  Hall M.D.   On: 0March 20, 2018 08:27     STUDIES:  CT head 2/23: 1. Acute  central pontine hemorrhage with estimated blood volume of 5 mL. Early extension into the left cerebellar peduncle and area postrema. No intraventricular or subarachnoid extension. 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 Family aware of grime prognosis. Will admit to ICU. Repeat CT head 1400 today. Keep SBP <140. Will need to re-address goals of care after repeat imaging.   ASSESSMENT / PLAN: NEUROLOGIC A:   Acute ICH suspect hypertensive related. ICH score 3 COMA Very poor prognosis w/ high risk of obstructive hydrocephalus  P:   RASS goal: -1 to -2 PAD protocol Repeat CT scan 1400 today  Neuro following EEG   SBP goal 100-140  PULMONARY A: Acute respiratory failure and inability to protect airway due to acute ICH P:   Full vent support PAD protocol F/u abg Am  cxr  CARDIOVASCULAR A:  HTN  P:  No anticoagulation  Tele Cont cleviprex for SBP goals   RENAL A:   No acute  P:   Trend cmp Cont IV hydration  Strict I&O  GASTROINTESTINAL A:   No acute  P:   SUP Start tubefeeds 2/24 if continuing supportive care   HEMATOLOGIC A:   No acute  P: No anticoagulation SCDs  INFECTIOUS A:   No evidence of infection  P:   Trend fever and wbc curve   ENDOCRINE A:   Mild hyperglycemia   P:   Am fasting glucose      FAMILY  - Updates:   - Inter-disciplinary family meet or Palliative Care meeting due by:  3/1  My ccm time 32 minutes Simonne Martinet ACNP-BC [facility] Pulmonary/Critical Care Pager # [phone] OR # [phone] if no answer  2016-09-20, 10:12 AM   STAFF NOTE: I, Rory Percy, MD FACP have personally reviewed patient's available data, including medical history, events of note, physical examination and test results as part of my evaluation. I have discussed with resident/NP and other care  providers such as pharmacist, RN and RRT. In addition, I personally evaluated patient and elicited key findings of: not alert, per sluggish 2mm, mild obese, CTA, not following commands, CT is impressive large pons bleed, HTN emergency associated likely, prop, nicardipine to MAP goals per neuro, pcxr noted and ett adjusted out, abg for MV and O2 needs, no need abx, no infection noted, does she need MRA?, per neuro, start feeds early in am , will call NS an d repeat CT head in 8 hours as such high risk hydro and need drain, I updated family and discussed c ode status they are just stunned and will discuss further, she has a poor prognosis The patient is critically ill with multiple organ systems failure and requires high complexity decision making for assessment and support, frequent evaluation and titration of therapies, application of advanced monitoring technologies and extensive interpretation of multiple databases.   Critical Care Time devoted to patient care services described in this note is 35 Minutes. This time reflects time of care of this signee: Rory Percy, MD FACP. This critical care time does not reflect procedure time, or teaching time or supervisory time of PA/NP/Med student/Med Resident etc but could involve care discussion time. Rest per NP/medical resident whose note is outlined above and that I agree with   Mcarthur Rossetti. Tyson Alias, MD, FACP Pgr: [phone] [facility] 2016-09-20 11:01 AM

## 2016-09-16 NOTE — Care Management Note (Signed)
Case Management Note  Patient Details  Name: Chelsea Schaefer MRN: [id number] Date of Birth: 08/04/1950  Subjective/Objective:     Pt admitted on Jul 28, 2016 s/p pontine ICH.  PTA, pt independent and living with family.                 Action/Plan: Pt currently intubated with poor prognosis.  Will follow.    Expected Discharge Date:                  Expected Discharge Plan:     In-House Referral:     Discharge planning Services  CM Consult  Post Acute Care Choice:    Choice offered to:     DME Arranged:    DME Agency:     HH Arranged:    HH Agency:     Status of Service:  In process, will continue to follow  If discussed at Long Length of Stay Meetings, dates discussed:    Additional Comments:  Glennon Macmerson, Alyah Boehning M, RN 11/11/2016, 4:58 PM

## 2016-09-16 NOTE — Progress Notes (Signed)
Pt transported from ED to 3M09 on ventilator. Pt stable throughout with no complications. VS within normal limits. RT will continue to monitor.

## 2016-09-16 NOTE — Progress Notes (Signed)
Pt transported to and from 3M09 to CT2 on ventilator. Pt stable throughout with no complications. VS within normal limits. RT will continue to monitor.

## 2016-09-16 NOTE — ED Triage Notes (Signed)
Pt to ER by [facility] from home where patient lives with family. Family heard patient get up and walk to restroom at 7 am, at 7:30 family found her on the floor in the bathroom unresponsive. On arrival to ER patient is withdrawing to pain on all 4 extremities otherwise unresponsive, cleared by EDP for CT. No known medical hx. EMS reports hypertensive at 174/96, CBG 113.

## 2016-09-16 NOTE — Consult Note (Signed)
CC:  Found unresponsive  HPI: Chelsea Schaefer is a 66 y.o. female who was found unresponsive at home. She was transported to the hospital. CT head shows pontine hemorrhage. She is current intubated. Family is not present at bedside. Nursing reports she does not speak English per family.  PMH: Past Medical History:  Diagnosis Date  . Arthritis   . Hypertension     PSH: History reviewed. No pertinent surgical history.  SH: Social History  Substance Use Topics  . Smoking status: Never Smoker  . Smokeless tobacco: Never Used  . Alcohol use No    MEDS: Prior to Admission medications   Not on File    ALLERGY: No Known Allergies  ROS: Unable to determine as pt is intubated ROS  NEUROLOGIC EXAM: Intubated.  PERRL.  Responds to painful stimulus with movement of Ue. Right UE possibly purposeful to pain Moves UE but not LE with painful stimunlus  IMAGING: CT reveals: IMPRESSION: 1. 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 Early extension into the left cerebellar peduncle and area postrema. No intraventricular or subarachnoid extension. Patent basilar cisterns. No significant intracranial mass effect at this time. 2. Underlying chronic small vessel disease. 3. ASPECTS is not applicable; acute hemorrhage.  IMPRESSION: - 66 y.o. female with pontine hemorrhage  PLAN: - She will have a repeat CT at 1400 - Further recs pending repeat CT scan

## 2016-09-16 NOTE — ED Provider Notes (Signed)
[facility]-EMERGENCY DEPT Provider Note   CSN: [id number] Arrival date & time: December 29, 2016  0803     History   Chief Complaint No chief complaint on file.   HPI Chelsea Schaefer is a 66 y.o. female.  HPI   66 year old female brought in as a "code stroke." Last seen normal at approximately 0700 today. History is primarily from her son who lives with her. She was in the bathroom brushing her teeth when she called out for help. When family came to assist her she was on the ground and poorly responsive. On EMS arrival, pt had irregular respirations. NPA placed with no response. BP 190s systolic. Glucose in 100s. Sinus rhythm on monitor. Not following any commands. Would spontaneously flex RUE.   Son reports that has previously been told that she has hypertension. She is not on any medications.   Past Medical History:  Diagnosis Date  . Arthritis   . Hypertension     There are no active problems to display for this patient.   No past surgical history on file.  OB History    No data available       Home Medications    Prior to Admission medications   Not on File    Family History No family history on file.  Social History Social History  Substance Use Topics  . Smoking status: Never Smoker  . Smokeless tobacco: Not on file  . Alcohol use No     Allergies   Patient has no known allergies.   Review of Systems Review of Systems  Level 5 caveat because pt is unresponsive.   Physical Exam Updated Vital Signs Wt 142 lb 10.2 oz (64.7 kg)   BMI 26.09 kg/m   Physical Exam  Constitutional: She appears well-developed and well-nourished. She appears distressed.  HENT:  Head: Normocephalic and atraumatic.  Eyes: Conjunctivae are normal. Right eye exhibits no discharge. Left eye exhibits no discharge.  Neck: Neck supple.  Cardiovascular: Normal rate, regular rhythm and normal heart sounds.  Exam reveals no gallop and no friction rub.   No murmur heard. Pulmonary/Chest:  Effort normal and breath sounds normal. No respiratory distress.  Abdominal: Soft. She exhibits no distension. There is no tenderness.  Musculoskeletal: She exhibits no edema or tenderness.  Neurological:  Eyes closed. Pupils equal. Not following commands. No verbal responses.  No obvious facial droop, but not holding her head up and difficult to ascertain. Will wince to painful stimuli. RUE with decorticate posturing. Repetitive clutching movements of L hand. Withdrawals all extremities to pain, but not localizing.   Skin: Skin is warm and dry.  Psychiatric:  Cannot assess  Nursing note and vitals reviewed.    ED Treatments / Results  Labs (all labs ordered are listed, but only abnormal results are displayed) Labs Reviewed  CBC - Abnormal; Notable for the following:       Result Value   WBC 17.3 (*)    RBC 5.38 (*)    MCH 25.5 (*)    All other components within normal limits  COMPREHENSIVE METABOLIC PANEL - Abnormal; Notable for the following:    CO2 20 (*)    Glucose, Bld 138 (*)    All other components within normal limits  TRIGLYCERIDES - Abnormal; Notable for the following:    Triglycerides 227 (*)    All other components within normal limits  CBG MONITORING, ED - Abnormal; Notable for the following:    Glucose-Capillary 138 (*)    All other  components within normal limits  I-STAT CHEM 8, ED - Abnormal; Notable for the following:    Glucose, Bld 141 (*)    Calcium, Ion 1.07 (*)    Hemoglobin 15.3 (*)    All other components within normal limits  PROTIME-INR  APTT  DIFFERENTIAL  I-STAT TROPOININ, ED  I-STAT ARTERIAL BLOOD GAS, ED    EKG  EKG Interpretation  Date/Time:  Friday 09-20-2016 08:30:23 EST Ventricular Rate:  90 PR Interval:    QRS Duration: 88 QT Interval:  374 QTC Calculation: 458 R Axis:   22 Text Interpretation:  Sinus rhythm Left ventricular hypertrophy Confirmed by Juleen China  MD, Baeleigh Devincent [phone]) on 09/20/16 9:17:09 AM        Radiology Dg Chest Portable 1 View  Result Date: 09/20/2016 CLINICAL DATA:  Unconscious.  Endotracheal tube placement. EXAM: PORTABLE CHEST 1 VIEW COMPARISON:  10/14/2015 FINDINGS: Endotracheal tube tip is at the orifice of the right mainstem bronchus. There is left basilar opacity with volume loss elevating the diaphragm and stomach. An orogastric tube reaches the stomach with tip at the fundus. The right lung is clear. Heart size is normal. These results were called by telephone at the time of interpretation on September 20, 2016 at 9:07 am to Dr. Raeford Razor , who verbally acknowledged these results. IMPRESSION: 1. Endotracheal tube tip at the right mainstem bronchus. 2. Left lower lobe atelectasis could be from #1 or aspiration. Electronically Signed   By: Marnee Spring M.D.   On: 2016-09-20 09:10   Ct Head Code Stroke W/o Cm  Result Date: 20-Sep-2016 CLINICAL DATA:  Code stroke. 66 year old female found unresponsive in the bathroom this morning. Initial encounter. EXAM: CT HEAD WITHOUT CONTRAST TECHNIQUE: Contiguous axial images were obtained from the base of the skull through the vertex without intravenous contrast. COMPARISON:  None. FINDINGS: Brain: Oval hyperdense hemorrhage situated in the central to dorsal pons with early extension into the left cerebellar peduncle and dorsally toward the area postrema (series 21, image 8). The dominant portion of hemorrhage encompasses 14 x 36 x 20 mm foreign estimated blood volume of 5 mL. There is mild expansion of the pons. The basilar cisterns remain patent. There is no intraventricular or subarachnoid extension at this time. Patchy and confluent bilateral cerebral white matter hypodensity, including involvement of the deep white matter capsules and possibly also the right basal ganglia. No cortical encephalomalacia. No ventriculomegaly. No other acute intracranial hemorrhage. No superimposed acute cortically based infarct. Vascular: Extensive Calcified  atherosclerosis at the skull base. No suspicious intracranial vascular hyperdensity (basilar artery density appears similar to the right ICA terminus series 21, image 11). Skull: No acute osseous abnormality identified. Sinuses/Orbits: Right nasal airway in place. Visualized paranasal sinuses and mastoids are well pneumatized. Other: Visualized orbits and scalp soft tissues are within normal limits. ASPECTS [facility] Stroke Program Early CT Score) Total score (0-10 with 10 being normal): Not applicable, acute intracranial hemorrhage. IMPRESSION: 1. 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 Early extension into the left cerebellar peduncle and area postrema. No intraventricular or subarachnoid extension. Patent basilar cisterns. No significant intracranial mass effect at this time. 2. Underlying chronic small vessel disease. 3. ASPECTS is not applicable; acute hemorrhage. 4. The above was relayed via text pager to Dr. Bennett Scrape on 09-20-2016 at 08:26 . Electronically Signed   By: Odessa Fleming M.D.   On: 2016/09/20 08:27    Procedures Procedures (including critical care time)  INTUBATION Performed by: Raeford Razor  Required items: required blood products, implants, devices, and special equipment available Patient identity confirmed: provided demographic data and hospital-assigned identification number Time out: Immediately prior to procedure a "time out" was called to verify the correct patient, procedure, equipment, support staff and site/side marked as required.  Indications: airway protection  Intubation method: Glidescope Laryngoscopy   Preoxygenation: BVM  Sedatives: Etomidate  Paralytic: Rocuronium  Tube Size: 7.5 cuffed  Post-procedure assessment: chest rise and ETCO2 monitor.  Breath sounds: Initially absent breath sounds on L. ETT pulled back 2cm then equal and absent over the epigastrium. Tube secured with: ETT holder Chest x-ray interpreted by radiologist  and me. ETT still low. Pulled back another 2cm.   Patient tolerated the procedure well with no immediate complications.  CRITICAL CARE Performed by: Raeford Razor   Total critical care time: 40 minutes  Critical care time was exclusive of separately billable procedures and treating other patients. Critical care was necessary to treat or prevent imminent or life-threatening deterioration. Critical care was time spent personally by me on the following activities: development of treatment plan with patient and/or surrogate as well as nursing, discussions with consultants, evaluation of patient's response to treatment, examination of patient, obtaining history from patient or surrogate, ordering and performing treatments and interventions, ordering and review of laboratory studies, ordering and review of radiographic studies, pulse oximetry and re-evaluation of patient's condition.    Medications Ordered in ED Medications - No data to display   Initial Impression / Assessment and Plan / ED Course  I have reviewed the triage vital signs and the nursing notes.  Pertinent labs & imaging results that were available during my care of the patient were reviewed by me and considered in my medical decision making (see chart for details).      Final Clinical Impressions(s) / ED Diagnoses   Final diagnoses:  Pontine hemorrhage (HCC)  Hypertension, unspecified type    65yF brought in as Code Stroke. CT with acute pontine bleed. Had to be intubated for airway protection. CXR noted. ETT pulled back a couple cm.  Apparently hx of HTN, but not on meds. No reported blood thinners. Clevidipine for BP control. Neurology/CCM. Pt's husband and son in ED. Language barrier but son speaks [place]. Updated that bleeding in area that is critically important for many functions and that had to be placed on ventilator.   New Prescriptions New Prescriptions   No medications on file     Raeford Razor,  MD 13-Oct-2016 [phone]

## 2016-09-16 NOTE — Code Documentation (Signed)
RSI complete without any complications. MD Kohut to consultation room to speak with family.

## 2016-09-16 NOTE — Code Documentation (Signed)
66 y.o. Non-english speaking female [facility]. Pt woke up this morning at 0700 and was reported to walk into the bathroom. Around 0730 the patient's family found her unresponsive in the floor. EMS was called. Upon EMS arrival pt was unresponsive laying in her bathroom floor with left sided hemiparesis. Code stroke was called. [facility] pt with NRB and nasal trumpet in place. Neruologically was unresponsive to voice, opened eyes to deep stimulation and withdrew all 4 extremities to pain. EDP cleared pt for CT and labs were drawn. CT showing acute central pontine hemorrhage with estimated blood volume of 5mL. Early extension into the left cerebellar peduncle and area postrema. No intraventricular or subarachnoid extension. Patent basilar cisterns. No significant intracranial mass effect at this time. Pt taken back to ED. While in ED room, pt with respiratory distress. EDP emergently intubates pt. Propofol gtt started. Cleviprex gtt started and titrated for goal SBP <140. NIHSS 29. ICH score 3 per CCM. See EMR for code stroke times and NIHSS. CCM and neurology MD discussed goals of care with family and family wishes to do everything possible at this time. Pt to get Nsx consult and be admitted to 80M. Bedside handoff with ED RN Pete GlatterHolley.

## 2016-09-16 NOTE — Progress Notes (Signed)
patient intubated for airway protection by MD with a 7.5 ETT taped at 23cm at lip then retracted to 21 cm at lip per X-Ray, good color change on ETCO2  Detector, good BBS, SATS 100%, will continue to monitor patient.

## 2016-09-16 NOTE — Progress Notes (Signed)
Repeat head CT reviewed.  Hemorrhage unchanged.  No hydrocephalus at this time.  Continue observation for neurological decline.

## 2016-09-16 NOTE — Consult Note (Signed)
Neurology Consult Note  Reason for Consultation: CODE STROKE  Requesting provider: Activated by EMS, ED MD Wilson Singer  CC: Unable to obtain--patient is intubated  HPI: This is a 66 year old woman who presented to the emergency department after being found unresponsive by family members at home. She was poorly responsive on arrival in the emergency department and was emergently intubated via rapid sequence intubation. CT scan of the head was obtained and showed a large acute hemorrhage in the pons. According to the ED nurse and the rapid response nurse, when the patient initially arrived, she had somewhat of a gaze preference. She had no movement of her extremities and was unresponsive to pain. NIH stroke skills course documented as 40. She had been intubated prior to my examination which is therefore limited.  PMH:  Past Medical History:  Diagnosis Date  . Arthritis   . Hypertension     PSH:  History reviewed. No pertinent surgical history.  Family history: History reviewed. No pertinent family history.  Social history:  Social History   Social History  . Marital status: Single    Spouse name: N/A  . Number of children: N/A  . Years of education: N/A   Occupational History  . Not on file.   Social History Main Topics  . Smoking status: Never Smoker  . Smokeless tobacco: Never Used  . Alcohol use No  . Drug use: No  . Sexual activity: Not on file   Other Topics Concern  . Not on file   Social History Narrative  . No narrative on file    Current outpatient meds: No outpatient prescriptions have been marked as taking for the 09/13/2016 encounter [facility] Encounter).    Current inpatient meds:  Current Facility-Administered Medications  Medication Dose Route Frequency Provider Last Rate Last Dose  . clevidipine (CLEVIPREX) infusion 0.5 mg/mL  0-21 mg/hr Intravenous Continuous Virgel Manifold, MD 20 mL/hr at 08/30/2016 0855 10 mg/hr at 08/27/2016 0855  . etomidate (AMIDATE)  injection   Intravenous PRN Virgel Manifold, MD   20 mg at 09/08/2016 0825  . propofol (DIPRIVAN) 1000 MG/100ML infusion           . propofol (DIPRIVAN) 1000 MG/100ML infusion  0-50 mcg/kg/min Intravenous Continuous Virgel Manifold, MD 1.9 mL/hr at 09/17/2016 0838 5 mcg/kg/min at 09/21/2016 0838  . rocuronium (ZEMURON) injection   Intravenous PRN Virgel Manifold, MD   60 mg at 09/07/2016 5852   No current outpatient prescriptions on file.    Allergies: No Known Allergies  ROS: As per HPI. A full 14-point review of systems Could not be obtained as the patient is intubated and comatose   PE:  BP 161/74   Pulse 102   Temp 97.1 F (36.2 C)   Resp 15   Wt 64.7 kg (142 lb 10.2 oz)   SpO2 100%   BMI 26.09 kg/m   General: WDWN woman lying on ED gurney. She is intubated. She has propofol 5 mcg/kg/min running at the time of my examination. She received paralytics approximately 30-40 minutes prior to my evaluation. She is unresponsive to verbal, tactile, and noxious stimulation. There is no eye opening.  HEENT: Normocephalic. Neck supple without LAD. ETT, OGT in place. Sclerae anicteric. No conjunctival injection.  CV: Regular, no murmur. Carotid pulses full and symmetric, no bruits. Distal pulses 2+ and symmetric.  Lungs: CTAB on anterior exam. Ventilated.  Abdomen: Soft, non-distended, non-tender. Bowel sounds hypoactive.  Extremities: No C/C/E. Neuro:  CN: Pupils are equal and round. They are  about 1.5 mm in size with reactivity difficult to discern. Eyes are midline without forced deviation. She does not blink to visual threat. No nystagmus. Oculocephalics are absent. Corneals are absent bilaterally. Her face appears to be grossly symmetric but is partly obscured by tubes and tape. No gag with endotracheal tube manipulation. The remainder of her cranial nerves cannot be accurately assessed as she does not participate with the examination.  Motor: Normal bulk for age. She is flaccid. No spontaneous  movement of the extremities is observed. No tremor or other abnormal movements.  Sensation: She has no response to central or peripheral noxious stimulation. DTRs: Trace throughout. Toes mute bilaterally.  Coordination and gait: These cannot be assessed as the patient is not able to participate with the examination.   Labs:  Lab Results  Component Value Date   WBC 17.3 (H) 09/11/2016   HGB 15.3 (H) 09/11/2016   HCT 45.0 09/15/2016   PLT 358 09/15/2016   GLUCOSE 141 (H) 08/27/2016   NA 141 09/14/2016   K 4.3 08/27/2016   CL 108 09/19/2016   CREATININE 0.70 09/02/2016   BUN 17 09/17/2016   CO2 26 10/14/2015   INR 0.98 09/09/2016   Troponin 0.00 LFTs normal PTT 27  Imaging: An independent reviewed the CT scan of the head without contrast from today. This shows a large area of acute hemorrhage in the dorsal pons measuring 14 x 36 x 20 mm. The estimated volume of the hemorrhages 5 mL. This results in some slight expansion of the pons. The hemorrhage extends into the left middle cerebellar peduncle. No ventricular enlargement is appreciated. She has moderate chronic small vessel ischemic change involving the bihemispheric white matter.   Assessment and Plan:  1. 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 Other less likely considerations include AVM, aneurysm, trauma, sympathomimetic abuse, and complication from anticoagulation or antiplatelet therapy. Coags unremarkable. Urine drug screen pending. No reported use of anticoagulants or antiplatelet drugs. Monitor blood pressure, goal SBP 140-160 mmHg. Avoid antiplatelet agents and NSAIDs for the 1-2 weeks. Avoid therapeutic systemic anticoagulation for the next 3-4 weeks. Repeat CTH in 8 hours to reassess for intraventricular extension and/or hydrocephalus that may require intervention.  Use SCDs for DVT prophylaxis. Avoid fever and hyperglycemia as these are associated with worse neurologic prognosis. Avoid hypotonic IVF to  minimize the risk of exacerbating cerebral edema. Frequent neuro checks.   ICH score is 3 with associated 30-day mortality 72%.   2. Coma: This is acute, due to pontine hemorrhage. Treatment is supportive. Unfortunately, her prognosis is very poor for meaningful neurological recovery in the setting of this large pontine bleed. We will need to continue to follow her exam closely.  This was discussed with the patient's family, including her husband, son, and daughter. They were in the visitation room in the emergency department. I initially met with them with Dr. Titus Mould and then also spoke with him myself after he departed. They were advised of the severity of the patient's hemorrhage and the associated poor prognosis. They are understandably tearful and are trying to process today's events. They were given the opportunity to ask any questions and these were addressed to their satisfaction.  This was discussed with the admitting physician, Dr. Titus Mould. I appreciate his assistance with this patient.  Thank you for this consult. Neurology will continue to follow. The stroke team will assume care of the patient beginning 09/17/16.  This patient is critically ill and at significant risk of neurological  worsening, death and care requires constant monitoring of vital signs, hemodynamics,respiratory and cardiac monitoring, neurological assessment, discussion with family, other specialists and medical decision making of high complexity. A total of 55 minutes of critical care time was spent on this case.

## 2016-09-16 NOTE — Code Documentation (Signed)
Pt returned to room, MD notified of snoring respirations and inability to control secretions, foaming at the mouth. RSI in progress at this time.

## 2016-09-17 ENCOUNTER — Inpatient Hospital Stay (HOSPITAL_COMMUNITY): Payer: Medicare Other

## 2016-09-17 DIAGNOSIS — I10 Essential (primary) hypertension: Secondary | ICD-10-CM | POA: Diagnosis not present

## 2016-09-17 DIAGNOSIS — D72829 Elevated white blood cell count, unspecified: Secondary | ICD-10-CM | POA: Diagnosis not present

## 2016-09-17 DIAGNOSIS — E782 Mixed hyperlipidemia: Secondary | ICD-10-CM

## 2016-09-17 DIAGNOSIS — I6789 Other cerebrovascular disease: Secondary | ICD-10-CM | POA: Diagnosis not present

## 2016-09-17 DIAGNOSIS — J9600 Acute respiratory failure, unspecified whether with hypoxia or hypercapnia: Secondary | ICD-10-CM

## 2016-09-17 DIAGNOSIS — I613 Nontraumatic intracerebral hemorrhage in brain stem: Secondary | ICD-10-CM | POA: Diagnosis not present

## 2016-09-17 LAB — URINALYSIS, COMPLETE (UACMP) WITH MICROSCOPIC
BILIRUBIN URINE: NEGATIVE
Bacteria, UA: NONE SEEN
GLUCOSE, UA: NEGATIVE mg/dL
KETONES UR: 20 mg/dL — AB
LEUKOCYTES UA: NEGATIVE
NITRITE: NEGATIVE
PH: 5 (ref 5.0–8.0)
Protein, ur: NEGATIVE mg/dL
SPECIFIC GRAVITY, URINE: 1.014 (ref 1.005–1.030)
Squamous Epithelial / LPF: NONE SEEN

## 2016-09-17 LAB — BASIC METABOLIC PANEL
ANION GAP: 13 (ref 5–15)
BUN: 8 mg/dL (ref 6–20)
CHLORIDE: 105 mmol/L (ref 101–111)
CO2: 20 mmol/L — AB (ref 22–32)
Calcium: 8.8 mg/dL — ABNORMAL LOW (ref 8.9–10.3)
Creatinine, Ser: 0.58 mg/dL (ref 0.44–1.00)
GFR calc Af Amer: >60 mL/min (ref >60)
GFR calc non Af Amer: >60 mL/min (ref >60)
GLUCOSE: 138 mg/dL — AB (ref 65–99)
POTASSIUM: 3.5 mmol/L (ref 3.5–5.1)
Sodium: 138 mmol/L (ref 135–145)

## 2016-09-17 LAB — CBC
HEMATOCRIT: 40.4 % (ref 36.0–46.0)
HEMOGLOBIN: 13.3 g/dL (ref 12.0–15.0)
MCH: 25.4 pg — ABNORMAL LOW (ref 26.0–34.0)
MCHC: 32.9 g/dL (ref 30.0–36.0)
MCV: 77.1 fL — AB (ref 78.0–100.0)
Platelets: 391 10*3/uL (ref 150–400)
RBC: 5.24 MIL/uL — AB (ref 3.87–5.11)
RDW: 15.5 % (ref 11.5–15.5)
WBC: 20.5 10*3/uL — ABNORMAL HIGH (ref 4.0–10.5)

## 2016-09-17 LAB — ECHOCARDIOGRAM COMPLETE
Height: 62 in
Weight: 2296.31 oz

## 2016-09-17 MED ORDER — PANTOPRAZOLE SODIUM 40 MG PO TBEC: 40.0000 mg | DELAYED_RELEASE_TABLET | Freq: Every day | ORAL | Status: DC

## 2016-09-17 MED ORDER — PANTOPRAZOLE SODIUM 40 MG PO PACK
40.0000 mg | PACK | Freq: Every day | ORAL | Status: DC
  Administered 2016-09-17 – 2016-09-25 (×9): 40 mg
  Filled 2016-09-17 (×9): qty 20

## 2016-09-17 MED ORDER — LOSARTAN POTASSIUM 50 MG PO TABS
50.0000 mg | ORAL_TABLET | Freq: Every day | ORAL | Status: DC
  Administered 2016-09-17: 50 mg via ORAL
  Filled 2016-09-17 (×2): qty 1

## 2016-09-17 NOTE — Assessment & Plan Note (Signed)
Per nsgy - no indication for EVD Neuro following

## 2016-09-17 NOTE — Progress Notes (Signed)
  Echocardiogram 2D Echocardiogram has been performed.  Chelsea Schaefer, Chelsea Schaefer R 09/17/2016, 10:35 AM

## 2016-09-17 NOTE — Assessment & Plan Note (Signed)
On cleviprex

## 2016-09-17 NOTE — H&P (Signed)
PULMONARY / CRITICAL CARE MEDICINE   Name: Chelsea Schaefer MRN: [id number] DOB: Dec 18, 1950    ADMISSION DATE:  2016-10-04 CONSULTATION DATE:  2/23  REFERRING MD:  Juleen China   CHIEF COMPLAINT:  ICH   BRIEF This is a 66 year old female w/ no sig history. Was in usual health until am of admit. Pt awoke around 0700 went to RR. About 0730 she was found unresponsive on the floor. On arrival to ER she would wd to pain. Was hypertensive w/ SBP in 170-180s. CT head + for pontine ICH. PCCM asked to admit.   STUDIES:  CT head 2/23: 1. 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 Early extension into the left cerebellar peduncle and area postrema. No intraventricular or subarachnoid extension. CT head (repeat 2/23)>>> EEG 2/23>>>  EVENTS 2016/10/04 - Unresponsive on vent  Admit   SUBJECTIVE/OVERNIGHT/INTERVAL HX 2./24/18 - daughter at bedside. Feels very sad. She is unresponsive. Oon cleviprex and saline. No indication for EVD per NSGY  VITAL SIGNS: BP 135/66   Pulse 94   Temp (!) 100.9 F (38.3 C) (Rectal)   Resp 16   Ht 5\' 2"  (1.575 m)   Wt 65.1 kg (143 lb 8.3 oz)   SpO2 98%   BMI 26.25 kg/m   HEMODYNAMICS:    VENTILATOR SETTINGS: Vent Mode: PRVC FiO2 (%):  [30 %-40 %] 30 % Set Rate:  [15 bmp] 15 bmp Vt Set:  [440 mL] 440 mL PEEP:  [5 cmH20] 5 cmH20 Plateau Pressure:  [11 cmH20-17 cmH20] 17 cmH20  INTAKE / OUTPUT: I/O last 3 completed shifts: In: 2470.2 [I.V.:2470.2] Out: 1875 [Urine:1875]  PHYSICAL EXAMINATION:  General Appearance:    Looks criticall ill. Overweight  Head:    Normocephalic, without obvious abnormality, atraumatic  Eyes:    PERRL - yes, conjunctiva/corneas - absent corneal per NSGY      Ears:    Normal external ear canals, both ears  Nose:   NG tube - no  Throat:  ETT TUBE - yes , OG tube - yes  Neck:   Supple,  No enlargement/tenderness/nodules     Lungs:     Clear to auscultation bilaterally, Ventilator   Synchrony - yes   Chest wall:    No deformity  Heart:    S1 and S2 normal, no murmur, CVP - na.  Pressors - none  Abdomen:     Soft, no masses, no organomegaly  Genitalia:    Not done  Rectal:   not done  Extremities:   Extremities- mild edema     Skin:   Intact in exposed areas . Sacral area - na     Neurologic:   Sedation - none -> RASS - -4 . No corneal. Pupils +, gag +, flicker of contraction on ext +     LABS:  PULMONARY  Recent Labs Lab 04-Oct-2016 0812 2016-10-04 1058  PHART  --  7.409  PCO2ART  --  36.5  PO2ART  --  102.0  HCO3  --  23.1  TCO2 26 24  O2SAT  --  98.0    CBC  Recent Labs Lab 2016/10/04 0806 10/04/16 0812 09/17/16 0400  HGB 13.7 15.3* 13.3  HCT 42.1 45.0 40.4  WBC 17.3*  --  20.5*  PLT 358  --  391    COAGULATION  Recent Labs Lab 10-04-16 0806 10/04/16 1108  INR 0.98 0.95    CARDIAC  No results for input(s): TROPONINI in the last 168  hours. No results for input(s): PROBNP in the last 168 hours.   CHEMISTRY  Recent Labs Lab 09-22-2016 0806 2016/09/22 0812 09/17/16 0400  NA 139 141 138  K 4.4 4.3 3.5  CL 105 108 105  CO2 20*  --  20*  GLUCOSE 138* 141* 138*  BUN 14 17 8   CREATININE 0.71 0.70 0.58  CALCIUM 9.6  --  8.8*   Estimated Creatinine Clearance: 62.1 mL/min (by C-G formula based on SCr of 0.58 mg/dL).   LIVER  Recent Labs Lab Sep 22, 2016 0806 09-22-2016 1108  AST 26  --   ALT 23  --   ALKPHOS 62  --   BILITOT 0.7  --   PROT 7.7  --   ALBUMIN 3.6  --   INR 0.98 0.95     INFECTIOUS No results for input(s): LATICACIDVEN, PROCALCITON in the last 168 hours.   ENDOCRINE CBG (last 3)   Recent Labs  22-Sep-2016 0828  GLUCAP 138*         IMAGING x48h  - image(s) personally visualized  -   highlighted in bold Ct Head Wo Contrast  Result Date: 09/22/2016 CLINICAL DATA:  Followup intracranial hemorrhage EXAM: CT HEAD WITHOUT CONTRAST TECHNIQUE: Contiguous axial images were obtained from the base of the skull through the  vertex without intravenous contrast. COMPARISON:  Earlier today FINDINGS: Brain: Ovoid acute hematoma in the pons and lower midbrain is slightly less thick than before at 12 mm in AP dimension compared to 13 mm previously. Craniocaudal span and transverse dimension are stable at 14 by 36 mm. Curvilinear hemorrhage along the fourth ventricular floor and lower midbrain has midly increased. Blood reaches the margin of the interpeduncular fossa without definite subarachnoid extension. No fourth ventricular effacement. Chronic microvascular ischemic change in the deep gray nuclei and deep white matter tracts. Vascular: No hyperdense vessel. Skull: Negative Sinuses/Orbits: Negative IMPRESSION: 1. Central pontine hematoma is essentially size stable from earlier today. Contiguous clot dissecting in the central midbrain and along the fourth ventricular floor is mildly increased. Patent fourth ventricle and no hydrocephalus. 2. Chronic microvascular disease. Electronically Signed   By: Marnee Spring M.D.   On: 2016-09-22 14:35   Dg Chest Port 1 View  Result Date: 09/17/2016 CLINICAL DATA:  Leukocytosis. EXAM: PORTABLE CHEST 1 VIEW COMPARISON:  2016-09-22. FINDINGS: No tracheal tube terminates 3.3 cm above the carina. Nasogastric tube terminates in the stomach. Heart size stable. Lungs are somewhat low in volume with linear atelectasis at the lung bases bilaterally, left greater than right. No pleural fluid. IMPRESSION: Minimal bibasilar subsegmental atelectasis. Left lower lobe aeration has improved in the interval. Electronically Signed   By: Leanna Battles M.D.   On: 09/17/2016 11:18   Dg Chest Portable 1 View  Result Date: 2016/09/22 CLINICAL DATA:  Unconscious.  Endotracheal tube placement. EXAM: PORTABLE CHEST 1 VIEW COMPARISON:  10/14/2015 FINDINGS: Endotracheal tube tip is at the orifice of the right mainstem bronchus. There is left basilar opacity with volume loss elevating the diaphragm and stomach. An  orogastric tube reaches the stomach with tip at the fundus. The right lung is clear. Heart size is normal. These results were called by telephone at the time of interpretation on 2016/09/22 at 9:07 am to Dr. Raeford Razor , who verbally acknowledged these results. IMPRESSION: 1. Endotracheal tube tip at the right mainstem bronchus. 2. Left lower lobe atelectasis could be from #1 or aspiration. Electronically Signed   By: Marnee Spring M.D.   On: 2016/09/22 09:10  Ct Head Code Stroke W/o Cm  Result Date: 09/03/2016 CLINICAL DATA:  Code stroke. 66 year old female found unresponsive in the bathroom this morning. Initial encounter. EXAM: CT HEAD WITHOUT CONTRAST TECHNIQUE: Contiguous axial images were obtained from the base of the skull through the vertex without intravenous contrast. COMPARISON:  None. FINDINGS: Brain: Oval hyperdense hemorrhage situated in the central to dorsal pons with early extension into the left cerebellar peduncle and dorsally toward the area postrema (series 21, image 8). The dominant portion of hemorrhage encompasses 14 x 36 x 20 mm foreign estimated blood volume of 5 mL. There is mild expansion of the pons. The basilar cisterns remain patent. There is no intraventricular or subarachnoid extension at this time. Patchy and confluent bilateral cerebral white matter hypodensity, including involvement of the deep white matter capsules and possibly also the right basal ganglia. No cortical encephalomalacia. No ventriculomegaly. No other acute intracranial hemorrhage. No superimposed acute cortically based infarct. Vascular: Extensive Calcified atherosclerosis at the skull base. No suspicious intracranial vascular hyperdensity (basilar artery density appears similar to the right ICA terminus series 21, image 11). Skull: No acute osseous abnormality identified. Sinuses/Orbits: Right nasal airway in place. Visualized paranasal sinuses and mastoids are well pneumatized. Other: Visualized orbits  and scalp soft tissues are within normal limits. ASPECTS [facility]([place] Stroke Program Early CT Score) Total score (0-10 with 10 being normal): Not applicable, acute intracranial hemorrhage. IMPRESSION: 1. 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 Early extension into the left cerebellar peduncle and area postrema. No intraventricular or subarachnoid extension. Patent basilar cisterns. No significant intracranial mass effect at this time. 2. Underlying chronic small vessel disease. 3. ASPECTS is not applicable; acute hemorrhage. 4. The above was relayed via text pager to Dr. Bennett Scrape. Oster on 09/15/2016 at 08:26 . Electronically Signed   By: Odessa FlemingH  Hall M.D.   On: 09/20/2016 08:27   DISCUSSION: Acute pontine ICH w/ high risk of obstruction and hydrocephalus. Family aware of grime prognosis. Will admit to ICU. Repeat CT head 1400 today. Keep SBP <140. Will need to re-address goals of care after repeat imaging.   ASSESSMENT and PLAN  Acute respiratory failure (HCC) Due to pontine hemorrhage Will not come off vent due to pontine hemorrhage  Plan Full vent support Vent liberation v trach dependent on goals of care  Non-traumatic Pontine ICH (intracerebral hemorrhage) (HCC)  with deep coma and ICH score 3 Per nsgy - no indication for EVD Neuro following  Hypertension On cleviprex      FAMILY  - Updates: 09/17/2016 --> daughter at bedside updated  - Inter-disciplinary family meet or Palliative Care meeting due by:  DAy 7. Current LOS is LOS 1 days  CODE STATUS    Code Status Orders        Start     Ordered   09/09/2016 0927  Full code  Continuous     09/20/2016 0932    Code Status History    Date Active Date Inactive Code Status Order ID Comments User Context   This patient has a current code status but no historical code status.        DISPO Keep in ICU      The patient is critically ill with multiple organ systems failure and requires high complexity decision  making for assessment and support, frequent evaluation and titration of therapies, application of advanced monitoring technologies and extensive interpretation of multiple databases.   Critical Care Time devoted to patient care services described in  this note is  30  Minutes. This time reflects time of care of this signee Dr Kalman Shan. This critical care time does not reflect procedure time, or teaching time or supervisory time of PA/NP/Med student/Med Resident etc but could involve care discussion time    Dr. Kalman Shan, M.D., [facility].C.P Pulmonary and Critical Care Medicine Staff Physician [facility] Pager: [phone], If no answer or between  15:00h - 7:00h: call [phone]  09/17/2016 1:38 PM

## 2016-09-17 NOTE — Progress Notes (Signed)
STROKE TEAM PROGRESS NOTE   HISTORY OF PRESENT ILLNESS (per record) This is a 66 year old woman who presented to the emergency department after being found unresponsive by family members at home. She was poorly responsive on arrival in the emergency department and was emergently intubated via rapid sequence intubation. CT scan of the head was obtained and showed a large acute hemorrhage in the pons. According to the ED nurse and the rapid response nurse, when the patient initially arrived, she had somewhat of a gaze preference. She had no movement of her extremities and was unresponsive to pain. NIH stroke skills course documented as 1729. She had been intubated prior to my examination which is therefore limited.  SUBJECTIVE (INTERVAL HISTORY) Her son is at the bedside.  Pt still intubated not on sedation, not open eyes on voice. As per son, pt BP at home is OK, not taking BP meds. Currently still on cleviprex for BP control.  Has intermittent low grade fever, on cooling blanket. Repeat CT no hydrocephalus.    OBJECTIVE Temp:  [97.1 F (36.2 C)-102.3 F (39.1 C)] 100.6 F (38.1 C) (02/24 0730) Pulse Rate:  [75-114] 96 (02/24 0330) Cardiac Rhythm: Normal sinus rhythm (02/24 0730) Resp:  [13-25] 15 (02/24 0730) BP: (91-210)/(46-118) 130/64 (02/24 0730) SpO2:  [96 %-100 %] 99 % (02/24 0730) FiO2 (%):  [30 %-40 %] 30 % (02/24 0730) Weight:  [65.1 kg (143 lb 8.3 oz)] 65.1 kg (143 lb 8.3 oz) (02/23 1043)  CBC:   Recent Labs Lab 09-03-16 0806 09-03-16 0812 09/17/16 0400  WBC 17.3*  --  20.5*  NEUTROABS 6.9  --   --   HGB 13.7 15.3* 13.3  HCT 42.1 45.0 40.4  MCV 78.3  --  77.1*  PLT 358  --  391    Basic Metabolic Panel:   Recent Labs Lab 09-03-16 0806 09-03-16 0812 09/17/16 0400  NA 139 141 138  K 4.4 4.3 3.5  CL 105 108 105  CO2 20*  --  20*  GLUCOSE 138* 141* 138*  BUN 14 17 8   CREATININE 0.71 0.70 0.58  CALCIUM 9.6  --  8.8*    Lipid Panel:     Component Value  Date/Time   CHOL 323 (H) 11-Jan-2017 0806   TRIG 227 (H) 11-Jan-2017 0834   HDL 37 (L) 11-Jan-2017 0806   CHOLHDL 8.7 11-Jan-2017 0806   VLDL 45 (H) 11-Jan-2017 0806   LDLCALC 241 (H) 11-Jan-2017 0806   HgbA1c: No results found for: HGBA1C Urine Drug Screen:     Component Value Date/Time   LABOPIA NONE DETECTED 11-Jan-2017 1930   COCAINSCRNUR NONE DETECTED 11-Jan-2017 1930   LABBENZ NONE DETECTED 11-Jan-2017 1930   AMPHETMU NONE DETECTED 11-Jan-2017 1930   THCU NONE DETECTED 11-Jan-2017 1930   LABBARB NONE DETECTED 11-Jan-2017 1930      IMAGING I have personally reviewed the radiological images below and agree with the radiology interpretations.  Ct Head Wo Contrast 08/29/16 1. Central pontine hematoma is essentially size stable from earlier today. Contiguous clot dissecting in the central midbrain and along the fourth ventricular floor is mildly increased. Patent fourth ventricle and no hydrocephalus.  2. Chronic microvascular disease.   Dg Chest Portable 1 View 08/29/16 1. Endotracheal tube tip at the right mainstem bronchus.  2. Left lower lobe atelectasis could be from #1 or aspiration.   Ct Head Code Stroke W/o Cm 08/29/16 1. 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 Early extension into the left  cerebellar peduncle and area postrema. No intraventricular or subarachnoid extension. Patent basilar cisterns. No significant intracranial mass effect at this time.  2. Underlying chronic small vessel disease.  3. ASPECTS is not applicable; acute hemorrhage.   TTE pending  CTA head and neck pending   PHYSICAL EXAM  Temp:  [99.5 F (37.5 C)-102.3 F (39.1 C)] 100.6 F (38.1 C) (02/24 0730) Pulse Rate:  [89-101] 101 (02/24 0813) Resp:  [13-26] 15 (02/24 1200) BP: (112-154)/(46-119) 119/59 (02/24 1200) SpO2:  [96 %-100 %] 99 % (02/24 1200) FiO2 (%):  [30 %-40 %] 30 % (02/24 0813)  General - Well nourished, well developed, intubated not on  sedation.  Ophthalmologic - Fundi not visualized.  Cardiovascular - Regular rate and rhythm.  Neuro - intubated off sedation, not open eyes on voice or pain. Pupils bilateral 2mm, briskly reactive to light. No doll's eyes, no corneal. Positive gag and cough. On pain stimulation, LUE and LLE mild withdraw, RUE mild extension, RLE no movement. Babinski negative bilaterally. Sensation, coordination or gait not tested.    ASSESSMENT/PLAN Ms. Tammera Engert is a 66 y.o. female with history of hypertension and arthritis presenting with unresponsiveness. She did not receive IV t-PA due to hemorrhagic stroke.  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  Resultant  coma  CT head - central pontine hemorrhage bilaterally  CT repeat - stable hematoma and no hydrocephalus  CTA head and neck - pending  2D Echo - pending  LDL - 241  HgbA1c - pending  VTE prophylaxis - SCDs Diet NPO time specified  No antithrombotic prior to admission, now on No antithrombotic  Ongoing aggressive stroke risk factor management  Therapy recommendations: pending  Disposition: Pending  Fever and leukocystosis  Intermittent low grade fever  On cooling blanket  WBC 17.3->20.5  UA pending  CXR - atelectasis  B Cx - pending  Sputum Cx - pending  Hypertension  Stable - IV Cleviprex  Add po Losartan, off cleviprex as able  BP goal < 140  Long-term BP goal normotensive  Hyperlipidemia  Home meds:  No lipid lowering medications PTA  LDL 241, goal < 70  Add statin when PO access is available.  Continue statin at discharge  Other Stroke Risk Factors  Advanced age  Other Active Problems  Intubated - possible aspiration  Hospital day # 1  This patient is critically ill due to brainstem ICH and at significant risk of neurological worsening, death form recurrent ICH, brain herniation, hydrocephalus. This patient's care requires  constant monitoring of vital signs, hemodynamics, respiratory and cardiac monitoring, review of multiple databases, neurological assessment, discussion with family, other specialists and medical decision making of high complexity. I spent 40 minutes of neurocritical care time in the care of this patient.  Marvel Plan, MD PhD Stroke Neurology 09/17/2016 12:20 PM   To contact Stroke Continuity provider, please refer to [email or web address]. After hours, contact General Neurology

## 2016-09-17 NOTE — Progress Notes (Signed)
PT Cancellation Note  Patient Details Name: Chelsea Schaefer MRN: [id number] DOB: 06-05-1951   Cancelled Treatment:    Reason Eval/Treat Not Completed: Patient not medically ready   Chelsea Schaefer 09/17/2016, 9:38 AM Chelsea Schaefer, PT DPT  [phone]

## 2016-09-17 NOTE — Progress Notes (Signed)
[facility] FOR AM LAB REPLACEMENT ONLY  The patient does not apply for the [facility] Electrolyte Re[facility]ment Protocol based on the criteria listed below:   Is urine output >/= 0.5 ml/kg/hr for the last 6 hours? No. Patient's UOP is 0.4 ml/kg/hr   Abnormal electrolyte(s): K3.5  6. If a panic level lab has been reported, has the CCM MD in charge been notified? Yes.  .   Physician:  Holland CommonsE Deterding, MD  Melrose NakayamaChisholm, Harleyquinn Gasser William 09/17/2016 6:23 AM

## 2016-09-17 NOTE — Progress Notes (Signed)
SLP Cancellation Note  Patient Details Name: Chelsea Schaefer MRN: [id number] DOB: 10/11/1950   Cancelled treatment:       Reason Eval/Treat Not Completed: Patient not medically ready  Rondel BatonMary Beth Lemoyne Scarpati, MS CF-SLP Speech-Language Pathologist [phone]  Arlana LindauMary E Kamara Allan 09/17/2016, 12:47 PM

## 2016-09-17 NOTE — Assessment & Plan Note (Signed)
Due to pontine hemorrhage Will not come off vent due to pontine hemorrhage  Plan Full vent support Vent liberation v trach dependent on goals of care

## 2016-09-17 NOTE — Progress Notes (Signed)
Pt seen and examined. No issues overnight.  EXAM: Temp:  [99.2 F (37.3 C)-102.3 F (39.1 C)] 100.6 F (38.1 C) (02/24 0730) Pulse Rate:  [89-114] 101 (02/24 0813) Resp:  [13-26] 18 (02/24 0915) BP: (91-154)/(46-119) 112/60 (02/24 0915) SpO2:  [96 %-100 %] 100 % (02/24 0915) FiO2 (%):  [30 %-40 %] 30 % (02/24 0813) Weight:  [65.1 kg (143 lb 8.3 oz)] 65.1 kg (143 lb 8.3 oz) (02/23 1043) Intake/Output      02/23 0701 - 02/24 0700 02/24 0701 - 02/25 0700   P.O. 0    I.V. (mL/kg) 2470.2 (37.9) 272 (4.2)   Total Intake(mL/kg) 2470.2 (37.9) 272 (4.2)   Urine (mL/kg/hr) 1875    Total Output 1875     Net +595.2 +272         Intubated and sedated PERRL Absent corneal reflex +gag Withdraws  Stable Continue current care No indication for EVD at this time

## 2016-09-18 ENCOUNTER — Inpatient Hospital Stay (HOSPITAL_COMMUNITY): Payer: Medicare Other

## 2016-09-18 DIAGNOSIS — I10 Essential (primary) hypertension: Secondary | ICD-10-CM | POA: Diagnosis not present

## 2016-09-18 DIAGNOSIS — R509 Fever, unspecified: Secondary | ICD-10-CM | POA: Diagnosis not present

## 2016-09-18 DIAGNOSIS — R6510 Systemic inflammatory response syndrome (SIRS) of non-infectious origin without acute organ dysfunction: Secondary | ICD-10-CM | POA: Diagnosis not present

## 2016-09-18 DIAGNOSIS — I613 Nontraumatic intracerebral hemorrhage in brain stem: Secondary | ICD-10-CM | POA: Diagnosis not present

## 2016-09-18 DIAGNOSIS — D72829 Elevated white blood cell count, unspecified: Secondary | ICD-10-CM | POA: Diagnosis not present

## 2016-09-18 DIAGNOSIS — J9600 Acute respiratory failure, unspecified whether with hypoxia or hypercapnia: Secondary | ICD-10-CM | POA: Diagnosis not present

## 2016-09-18 LAB — BASIC METABOLIC PANEL
Anion gap: 15 (ref 5–15)
BUN: 6 mg/dL (ref 6–20)
CALCIUM: 8.6 mg/dL — AB (ref 8.9–10.3)
CO2: 17 mmol/L — ABNORMAL LOW (ref 22–32)
Chloride: 107 mmol/L (ref 101–111)
Creatinine, Ser: 0.47 mg/dL (ref 0.44–1.00)
GFR calc Af Amer: >60 mL/min (ref >60)
GLUCOSE: 121 mg/dL — AB (ref 65–99)
Potassium: 4.9 mmol/L (ref 3.5–5.1)
SODIUM: 139 mmol/L (ref 135–145)

## 2016-09-18 LAB — PROCALCITONIN: Procalcitonin: 0.16 ng/mL

## 2016-09-18 LAB — LACTIC ACID, PLASMA: Lactic Acid, Venous: 1.9 mmol/L (ref 0.5–1.9)

## 2016-09-18 LAB — CBC
HCT: 41.9 % (ref 36.0–46.0)
Hemoglobin: 14.2 g/dL (ref 12.0–15.0)
MCH: 25.9 pg — AB (ref 26.0–34.0)
MCHC: 33.9 g/dL (ref 30.0–36.0)
MCV: 76.5 fL — ABNORMAL LOW (ref 78.0–100.0)
PLATELETS: 297 10*3/uL (ref 150–400)
RBC: 5.48 MIL/uL — ABNORMAL HIGH (ref 3.87–5.11)
RDW: 15.6 % — AB (ref 11.5–15.5)
WBC: 27 10*3/uL — AB (ref 4.0–10.5)

## 2016-09-18 LAB — HEMOGLOBIN A1C
HEMOGLOBIN A1C: 5.9 % — AB (ref 4.8–5.6)
MEAN PLASMA GLUCOSE: 123 mg/dL

## 2016-09-18 MED ORDER — ORAL CARE MOUTH RINSE
15.0000 mL | OROMUCOSAL | Status: DC
  Administered 2016-09-18 – 2016-09-26 (×74): 15 mL via OROMUCOSAL

## 2016-09-18 MED ORDER — HEPARIN SODIUM (PORCINE) 5000 UNIT/ML IJ SOLN
5000.0000 [IU] | Freq: Three times a day (TID) | INTRAMUSCULAR | Status: DC
  Administered 2016-09-18 – 2016-09-21 (×9): 5000 [IU] via SUBCUTANEOUS
  Filled 2016-09-18 (×9): qty 1

## 2016-09-18 MED ORDER — IOPAMIDOL (ISOVUE-370) INJECTION 76%
INTRAVENOUS | Status: AC
  Administered 2016-09-18: 50 mL
  Filled 2016-09-18: qty 50

## 2016-09-18 MED ORDER — LOSARTAN POTASSIUM 50 MG PO TABS
50.0000 mg | ORAL_TABLET | Freq: Two times a day (BID) | ORAL | Status: DC
  Administered 2016-09-18 – 2016-09-20 (×6): 50 mg via ORAL
  Filled 2016-09-18 (×6): qty 1

## 2016-09-18 MED ORDER — ATORVASTATIN CALCIUM 40 MG PO TABS
40.0000 mg | ORAL_TABLET | Freq: Every day | ORAL | Status: DC
  Administered 2016-09-18 – 2016-09-25 (×8): 40 mg via ORAL
  Filled 2016-09-18 (×8): qty 1

## 2016-09-18 NOTE — Progress Notes (Signed)
PT Cancellation Note  Patient Details Name: Chelsea Schaefer MRN: [id number] DOB: 01/07/1951   Cancelled Treatment:    Reason Eval/Treat Not Completed: Patient not medically ready   Chelsea Schaefer 09/18/2016, 8:02 AM  Charlotte Crumbevon Jeanmarc Viernes, PT DPT  [phone]

## 2016-09-18 NOTE — Progress Notes (Signed)
SLP Cancellation Note  Patient Details Name: Chelsea Schaefer MRN: [id number] DOB: 30-Dec-1950   Cancelled treatment:       Reason Eval/Treat Not Completed: Patient not medically ready, still intubated.   [place] Kungleksiak, Amy K, MA, CCC-SLP 09/18/2016, 10:28 AM [phone]

## 2016-09-18 NOTE — Progress Notes (Addendum)
STROKE TEAM PROGRESS NOTE   SUBJECTIVE (INTERVAL HISTORY) Her son and daughter are at the bedside.  Pt still intubated not on sedation, not open eyes on voice. Repeat CT no hydrocephalus with stable hematoma. As per son, pt seems moving all extremities more than yesterday. Still on cleviprex but dose lower than yesterday. Still has intermittent fever and worsening leukocytosis.    OBJECTIVE Temp:  [97.9 F (36.6 C)-101 F (38.3 C)] 97.9 F (36.6 C) (02/25 0800) Pulse Rate:  [92-98] 98 (02/25 0349) Cardiac Rhythm: Normal sinus rhythm (02/25 0800) Resp:  [14-20] 15 (02/25 0845) BP: (108-152)/(53-95) 143/69 (02/25 0845) SpO2:  [97 %-100 %] 100 % (02/25 0845) FiO2 (%):  [30 %] 30 % (02/25 0800)  CBC:   Recent Labs Lab 10/07/16 0806  09/17/16 0400 09/18/16 0338  WBC 17.3*  --  20.5* 27.0*  NEUTROABS 6.9  --   --   --   HGB 13.7  < > 13.3 14.2  HCT 42.1  < > 40.4 41.9  MCV 78.3  --  77.1* 76.5*  PLT 358  --  391 297  < > = values in this interval not displayed.  Basic Metabolic Panel:   Recent Labs Lab 09/17/16 0400 09/18/16 0338  NA 138 139  K 3.5 4.9  CL 105 107  CO2 20* 17*  GLUCOSE 138* 121*  BUN 8 6  CREATININE 0.58 0.47  CALCIUM 8.8* 8.6*    Lipid Panel:     Component Value Date/Time   CHOL 323 (H) October 07, 2016 0806   TRIG 227 (H) 07-Oct-2016 0834   HDL 37 (L) 10/07/16 0806   CHOLHDL 8.7 07-Oct-2016 0806   VLDL 45 (H) 2016/10/07 0806   LDLCALC 241 (H) 10-07-2016 0806   HgbA1c: No results found for: HGBA1C Urine Drug Screen:     Component Value Date/Time   LABOPIA NONE DETECTED 10/07/2016 1930   COCAINSCRNUR NONE DETECTED October 07, 2016 1930   LABBENZ NONE DETECTED October 07, 2016 1930   AMPHETMU NONE DETECTED 10-07-16 1930   THCU NONE DETECTED Oct 07, 2016 1930   LABBARB NONE DETECTED 10/07/2016 1930      IMAGING I have personally reviewed the radiological images below and agree with the radiology interpretations.  Ct Head Wo Contrast 07-Oct-2016 1.  Central pontine hematoma is essentially size stable from earlier today. Contiguous clot dissecting in the central midbrain and along the fourth ventricular floor is mildly increased. Patent fourth ventricle and no hydrocephalus.  2. Chronic microvascular disease.   Dg Chest Portable 1 View October 07, 2016 1. Endotracheal tube tip at the right mainstem bronchus.  2. Left lower lobe atelectasis could be from #1 or aspiration.   Ct Head Code Stroke W/o Cm Oct 07, 2016 1. 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 Early extension into the left cerebellar peduncle and area postrema. No intraventricular or subarachnoid extension. Patent basilar cisterns. No significant intracranial mass effect at this time.  2. Underlying chronic small vessel disease.  3. ASPECTS is not applicable; acute hemorrhage.   TTE  09/17/2016 Study Conclusions - Left ventricle: The cavity size was normal. Wall thickness was   increased in a pattern of mild LVH. Systolic function was   vigorous. The estimated ejection fraction was in the range of 65% to 70%.    Wall motion was normal; there were no regional wall   motion abnormalities. Doppler parameters are consistent with   abnormal left ventricular relaxation (grade 1 diastolic   dysfunction). - Aortic valve: Valve area (VTI): 1.77 cm^2.  Valve area (Vmax):   1.37 cm^2. Valve area (Vmean): 1.79 cm^2. - Atrial septum: No defect or patent foramen ovale was identified. - Technically adequate study.  CTA Head and Neck 09/18/2016  CTA NECK  1. Negative CTA of the head and neck. No flow limiting or critical stenosis identified. 2. Emphysema.  CTA HEAD  1. Stable size of acute pontine hemorrhage with slightly increased localized vasogenic edema.  There is mildly increased mass effect on the fourth ventricle posteriorly with slightly increased ventricular dilatation. No frank hydrocephalus. Continued close interval monitoring is warranted. 2. No  aneurysm or other vascular abnormality identified underlying the acute pontine hemorrhage. 3. 3 mm left P com aneurysm. 4. Mild atheromatous plaque involving the cavernous ICAs and right V4 segment without high-grade stenosis.   PHYSICAL EXAM  Temp:  [97.9 F (36.6 C)-101 F (38.3 C)] 97.9 F (36.6 C) (02/25 0800) Pulse Rate:  [92-98] 98 (02/25 0349) Resp:  [14-20] 15 (02/25 0845) BP: (108-152)/(53-95) 143/69 (02/25 0845) SpO2:  [97 %-100 %] 100 % (02/25 0845) FiO2 (%):  [30 %] 30 % (02/25 0800)  General - Well nourished, well developed, intubated not on sedation.  Ophthalmologic - Fundi not visualized.  Cardiovascular - Regular rate and rhythm.  Neuro - intubated off sedation, not open eyes on voice or pain. Pupils bilateral 2mm, briskly reactive to light. sluggish doll's eyes, no corneal. Positive gag and cough. On pain stimulation, LUE and LLE mild withdraw not able to against gravity, RUE and RLE withdraw against gravity. Babinski negative bilaterally. Sensation, coordination or gait not tested.    ASSESSMENT/PLAN Ms. Donella Stadeu Thi Loftin is a 66 y.o. female with history of hypertension and arthritis presenting with unresponsiveness. She did not receive IV t-PA due to hemorrhagic stroke.  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  Resultant  coma  CT head - central pontine hemorrhage bilaterally  CT repeat - stable hematoma and no hydrocephalus  CTA H&N - Stable hematoma and no hydrocephalus. No AVM. 3mm left PCOM aneurysm  2D Echo - EF 65-70%. No cardiac source of emboli identified.  LDL - 241  HgbA1c - pending  VTE prophylaxis - heparin subq  Diet NPO time specified  No antithrombotic prior to admission, now on No antithrombotic  Ongoing aggressive stroke risk factor management  Therapy recommendations: pending  Disposition: Pending  Fever and leukocystosis  Intermittent low grade fever   On cooling  blanket  WBC 17.3->20.5 -> 27  UA negative  CXR - atelectasis  B Cx - pending  Sputum Cx - pending  May consider Abx empiric treatment, defer to CCM  Respiratory failure  Due to pontine ICH  On vent  CCM on board  May need to consider trach earlier  Hypertension  Stable - IV Cleviprex  Increase Losartan to 50mg  bid, off cleviprex as able  BP goal < 140  Long-term BP goal normotensive  Hyperlipidemia  Home meds:  No lipid lowering medications PTA  LDL 241, goal < 70  Add lipitor 40mg  daily.  Continue statin at discharge  Other Stroke Risk Factors  Advanced age  Other Active Problems  Intubated  3 mm left P com aneurysm.  Hospital day # 2  This patient is critically ill due to brainstem ICH, fever, leukocytosis, HLD and at significant risk of neurological worsening, death form recurrent ICH, brain herniation, hydrocephalus. This patient's care requires constant monitoring of vital signs, hemodynamics, respiratory and cardiac monitoring, review of multiple databases,  neurological assessment, discussion with family, other specialists and medical decision making of high complexity. I spent 40 minutes of neurocritical care time in the care of this patient. I had long discussion with son at bedside, updated him about pt condition, treatment options and possible prognosis. He expressed understanding  Marvel Plan, MD PhD Stroke Neurology 09/18/2016 9:38 AM    To contact Stroke Continuity provider, please refer to [email or web address]. After hours, contact General Neurology

## 2016-09-18 NOTE — Progress Notes (Signed)
OT Cancellation Note  Patient Details Name: Chelsea Schaefer MRN: [id number] DOB: [birth year]   Cancelled Treatment:    Reason Eval/Treat Not Completed: Medical issues which prohibited therapy (active bedrest orders). Will follow up as time allows.  Gaye AlkenBailey A Katalia Choma M.S., OTR/L Pager: [phone]  09/18/2016, 8:02 AM

## 2016-09-18 NOTE — Assessment & Plan Note (Signed)
Vent dependent due to coma  Plan ful vent support

## 2016-09-18 NOTE — H&P (Signed)
PULMONARY / CRITICAL CARE MEDICINE   Name: Chelsea Schaefer MRN: [id number] DOB: [birth year]    ADMISSION DATE:  10-14-16 CONSULTATION DATE:  2/23  REFERRING MD:  Juleen China   CHIEF COMPLAINT:  ICH   BRIEF This is a 66 year old female w/ no sig history. Was in usual health until am of admit. Pt awoke around 0700 went to RR. About 0730 she was found unresponsive on the floor. On arrival to ER she would wd to pain. Was hypertensive w/ SBP in 170-180s. CT head + for pontine ICH. PCCM asked to admit.   STUDIES:  CT head 2/23: 1. 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 Early extension into the left cerebellar peduncle and area postrema. No intraventricular or subarachnoid extension. CT head (repeat 2/23)>>> EEG 2/23>>>  EVENTS 10/14/16 - Unresponsive on vent  Admit  2./24/18 - daughter at bedside. Feels very sad. She is unresponsive. Oon cleviprex and saline. No indication for EVD per NSGY    SUBJECTIVE/OVERNIGHT/INTERVAL HX 2/25 - seen by dr Ditty - no role for EVD at this point and he signed of. Pe neuro CT head stable without hydrocephalus. Increased movement of extremities. Still on clevipperx. Fever going down but wBC going up   VITAL SIGNS: BP (!) 144/73   Pulse 80   Temp 100.3 F (37.9 C) (Rectal)   Resp 15   Ht 5\' 2"  (1.575 m)   Wt 65.1 kg (143 lb 8.3 oz)   SpO2 100%   BMI 26.25 kg/m   HEMODYNAMICS:    VENTILATOR SETTINGS: Vent Mode: PRVC FiO2 (%):  [30 %] 30 % Set Rate:  [15 bmp] 15 bmp Vt Set:  [440 mL] 440 mL PEEP:  [5 cmH20] 5 cmH20 Plateau Pressure:  [15 cmH20-20 cmH20] 18 cmH20  INTAKE / OUTPUT: I/O last 3 completed shifts: In: 3899 [I.V.:3899] Out: 2055 [Urine:2055]  PHYSICAL EXAMINATION:   General Appearance:    Looks criticall ill  Head:    Normocephalic, without obvious abnormality, atraumatic  Eyes:    PERRL - yes, conjunctiva/corneas - clear      Ears:    Normal external ear canals, both ears  Nose:   NG tube -  no  Throat:  ETT TUBE - yes , OG tube - yes  Neck:   Supple,  No enlargement/tenderness/nodules     Lungs:     Clear to auscultation bilaterally, Ventilator   Synchrony - yes  Chest wall:    No deformity  Heart:    S1 and S2 normal, no murmur, CVP - na.  Pressors - no  Abdomen:     Soft, no masses, no organomegaly  Genitalia:    Not done  Rectal:   not done  Extremities:   Extremities- no edema     Skin:   Intact in exposed areas . Sacral area - intact per rn     Neurologic:   Sedation - none -> RASS - -4 . Moves all 4s - inceased spont movement       LABS:  PULMONARY  Recent Labs Lab 10/14/2016 0812 10-14-2016 1058  PHART  --  7.409  PCO2ART  --  36.5  PO2ART  --  102.0  HCO3  --  23.1  TCO2 26 24  O2SAT  --  98.0    CBC  Recent Labs Lab 2016/10/14 0806 2016-10-14 0812 09/17/16 0400 09/18/16 0338  HGB 13.7 15.3* 13.3 14.2  HCT 42.1 45.0 40.4 41.9  WBC 17.3*  --  20.5* 27.0*  PLT 358  --  391 297    COAGULATION  Recent Labs Lab 09/05/2016 0806 08/29/2016 1108  INR 0.98 0.95    CARDIAC  No results for input(s): TROPONINI in the last 168 hours. No results for input(s): PROBNP in the last 168 hours.   CHEMISTRY  Recent Labs Lab 08/25/2016 0806 09/12/2016 0812 09/17/16 0400 09/18/16 0338  NA 139 141 138 139  K 4.4 4.3 3.5 4.9  CL 105 108 105 107  CO2 20*  --  20* 17*  GLUCOSE 138* 141* 138* 121*  BUN 14 17 8 6   CREATININE 0.71 0.70 0.58 0.47  CALCIUM 9.6  --  8.8* 8.6*   Estimated Creatinine Clearance: 62.1 mL/min (by C-G formula based on SCr of 0.47 mg/dL).   LIVER  Recent Labs Lab 09/07/2016 0806 08/26/2016 1108  AST 26  --   ALT 23  --   ALKPHOS 62  --   BILITOT 0.7  --   PROT 7.7  --   ALBUMIN 3.6  --   INR 0.98 0.95     INFECTIOUS No results for input(s): LATICACIDVEN, PROCALCITON in the last 168 hours.   ENDOCRINE CBG (last 3)   Recent Labs  09/09/2016 0828  GLUCAP 138*         IMAGING x48h  - image(s) personally  visualized  -   highlighted in bold Ct Angio Head W Or Wo Contrast  Result Date: 09/18/2016 CLINICAL DATA:  Follow-up examination for acute intracranial hemorrhage. EXAM: CT ANGIOGRAPHY HEAD AND NECK TECHNIQUE: Multidetector CT imaging of the head and neck was performed using the standard protocol during bolus administration of intravenous contrast. Multiplanar CT image reconstructions and MIPs were obtained to evaluate the vascular anatomy. Carotid stenosis measurements (when applicable) are obtained utilizing NASCET criteria, using the distal internal carotid diameter as the denominator. CONTRAST:  50 cc of Isovue 370. COMPARISON:  Prior CT from 08/30/2016. FINDINGS: CT HEAD FINDINGS Brain: Somewhat oblong parenchymal hematoma centered at the dorsal pons again seen. Hematoma measures 13 x 35 x 25 mm (estimated volume 80 cc). Surrounding low-density vasogenic edema minimally increased. Minimal extension into the left cerebellar peduncle. Hemorrhage closely approximates the floor of the fourth ventricle without direct intraventricular extension. Associated mass effect on the fourth ventricle is slightly increased, which is now partially effaced. No intraventricular extension of hemorrhage. Overall ventricular dilatation is slightly increased as compared to previous exam without frank hydrocephalus at this time. Possible trace subarachnoid hemorrhage noted within the sylvian fissures bilaterally. Chronic microvascular ischemic changes again noted. No acute large vessel territory infarct. No other new acute intracranial hemorrhage. No extra-axial fluid collection. Vascular: No hyperdense vessel. Extensive vascular calcifications within the carotid siphons and distal vertebral arteries. Skull: Scalp soft tissues unremarkable.  Calvarium intact. Sinuses:   Paranasal sinuses remain clear.  No mastoid effusion. Orbits: Globes and orbital soft tissues demonstrate no acute abnormality. CTA NECK FINDINGS Aortic arch:  Visualized aortic arch of normal caliber with normal 3 vessel morphology. Mild plaque within the arch itself. No high-grade stenosis about the origin of the great vessels. Visualized subclavian arteries are widely patent. Right carotid system: Right common carotid artery widely patent from its origin to the bifurcation. Mild atheromatous irregularity about the right bifurcation without significant stenosis. Right ICA widely patent from the bifurcation to the skullbase without stenosis, dissection, or occlusion. Left carotid system: Left common carotid artery patent from its origin to the bifurcation without stenosis. No significant atheromatous narrowing about the  left bifurcation. Left ICA widely patent from the bifurcation to the skullbase without stenosis, dissection, or occlusion. Vertebral arteries: Both of the vertebral arteries arise from the subclavian arteries. Vertebral arteries largely code dominant. Vertebral arteries widely patent within the neck without stenosis, dissection, or occlusion. Skeleton: No acute osseous abnormality. No worrisome lytic or blastic osseous lesions. Mild degenerative spondylolysis present at C5-6 and C6-7. Other neck: Patient is intubated with endotracheal and enteric tubes in place. No acute soft tissue abnormality within the neck. Thyroid normal. No adenopathy. Upper chest: Visualized upper mediastinum within normal limits. Small layering left pleural effusion noted. Visualized lungs are otherwise clear. Mild emphysema noted. Review of the MIP images confirms the above findings CTA HEAD FINDINGS Anterior circulation: Petrous segments widely patent bilaterally. Scattered atheromatous plaque within the cavernous/ supraclinoid ICAs without significant stenosis. 3 mm focal outpouching arising from the supraclinoid left ICA compatible with a small P com type aneurysm (series 13, image 106). This is directed inferiorly and posteriorly. Right A1 segment patent. Left A1 segment  hypoplastic but patent. Anterior communicating artery normal. Anterior cerebral arteries widely patent to their distal aspects. M1 segments patent without stenosis or occlusion. MCA bifurcations normal. Distal MCA branches well opacified and symmetric. Posterior circulation: Right vertebral artery slightly dominant. Multifocal atheromatous plaque within the right V4 segment with mild multifocal narrowing (series 11, image 147). Left vertebral artery widely patent to the vertebrobasilar junction. Posterior inferior cerebral arteries patent bilaterally. Basilar artery widely patent to its distal aspect. Superior cerebral arteries patent bilaterally. Both of the posterior cerebral arteries largely supplied via the basilar artery and are widely patent to their distal aspects. No aneurysm or vascular malformation seen underlying the pontine hematoma. Venous sinuses: Patent. Anatomic variants: No significant anatomic variant. Delayed phase: No pathologic enhancement. Review of the MIP images confirms the above findings IMPRESSION: CTA NECK IMPRESSION: 1. Negative CTA of the head and neck. No flow limiting or critical stenosis identified. 2. Emphysema. CTA HEAD IMPRESSION: 1. Stable size of acute pontine hemorrhage with slightly increased localized vasogenic edema. There is mildly increased mass effect on the fourth ventricle posteriorly with slightly increased ventricular dilatation. No frank hydrocephalus. Continued close interval monitoring is warranted. 2. No aneurysm or other vascular abnormality identified underlying the acute pontine hemorrhage. 3. 3 mm left P com aneurysm. 4. Mild atheromatous plaque involving the cavernous ICAs and right V4 segment without high-grade stenosis. Electronically Signed   By: Rise MuBenjamin  McClintock M.D.   On: 09/18/2016 06:59   Ct Head Wo Contrast  Result Date: 09/01/2016 CLINICAL DATA:  Followup intracranial hemorrhage EXAM: CT HEAD WITHOUT CONTRAST TECHNIQUE: Contiguous axial  images were obtained from the base of the skull through the vertex without intravenous contrast. COMPARISON:  Earlier today FINDINGS: Brain: Ovoid acute hematoma in the pons and lower midbrain is slightly less thick than before at 12 mm in AP dimension compared to 13 mm previously. Craniocaudal span and transverse dimension are stable at 14 by 36 mm. Curvilinear hemorrhage along the fourth ventricular floor and lower midbrain has midly increased. Blood reaches the margin of the interpeduncular fossa without definite subarachnoid extension. No fourth ventricular effacement. Chronic microvascular ischemic change in the deep gray nuclei and deep white matter tracts. Vascular: No hyperdense vessel. Skull: Negative Sinuses/Orbits: Negative IMPRESSION: 1. Central pontine hematoma is essentially size stable from earlier today. Contiguous clot dissecting in the central midbrain and along the fourth ventricular floor is mildly increased. Patent fourth ventricle and no hydrocephalus. 2. Chronic microvascular disease. Electronically  Signed   By: Marnee Spring M.D.   On: 08/27/2016 14:35   Ct Angio Neck W Or Wo Contrast  Result Date: 09/18/2016 CLINICAL DATA:  Follow-up examination for acute intracranial hemorrhage. EXAM: CT ANGIOGRAPHY HEAD AND NECK TECHNIQUE: Multidetector CT imaging of the head and neck was performed using the standard protocol during bolus administration of intravenous contrast. Multiplanar CT image reconstructions and MIPs were obtained to evaluate the vascular anatomy. Carotid stenosis measurements (when applicable) are obtained utilizing NASCET criteria, using the distal internal carotid diameter as the denominator. CONTRAST:  50 cc of Isovue 370. COMPARISON:  Prior CT from 09/08/2016. FINDINGS: CT HEAD FINDINGS Brain: Somewhat oblong parenchymal hematoma centered at the dorsal pons again seen. Hematoma measures 13 x 35 x 25 mm (estimated volume 80 cc). Surrounding low-density vasogenic edema  minimally increased. Minimal extension into the left cerebellar peduncle. Hemorrhage closely approximates the floor of the fourth ventricle without direct intraventricular extension. Associated mass effect on the fourth ventricle is slightly increased, which is now partially effaced. No intraventricular extension of hemorrhage. Overall ventricular dilatation is slightly increased as compared to previous exam without frank hydrocephalus at this time. Possible trace subarachnoid hemorrhage noted within the sylvian fissures bilaterally. Chronic microvascular ischemic changes again noted. No acute large vessel territory infarct. No other new acute intracranial hemorrhage. No extra-axial fluid collection. Vascular: No hyperdense vessel. Extensive vascular calcifications within the carotid siphons and distal vertebral arteries. Skull: Scalp soft tissues unremarkable.  Calvarium intact. Sinuses:   Paranasal sinuses remain clear.  No mastoid effusion. Orbits: Globes and orbital soft tissues demonstrate no acute abnormality. CTA NECK FINDINGS Aortic arch: Visualized aortic arch of normal caliber with normal 3 vessel morphology. Mild plaque within the arch itself. No high-grade stenosis about the origin of the great vessels. Visualized subclavian arteries are widely patent. Right carotid system: Right common carotid artery widely patent from its origin to the bifurcation. Mild atheromatous irregularity about the right bifurcation without significant stenosis. Right ICA widely patent from the bifurcation to the skullbase without stenosis, dissection, or occlusion. Left carotid system: Left common carotid artery patent from its origin to the bifurcation without stenosis. No significant atheromatous narrowing about the left bifurcation. Left ICA widely patent from the bifurcation to the skullbase without stenosis, dissection, or occlusion. Vertebral arteries: Both of the vertebral arteries arise from the subclavian arteries.  Vertebral arteries largely code dominant. Vertebral arteries widely patent within the neck without stenosis, dissection, or occlusion. Skeleton: No acute osseous abnormality. No worrisome lytic or blastic osseous lesions. Mild degenerative spondylolysis present at C5-6 and C6-7. Other neck: Patient is intubated with endotracheal and enteric tubes in place. No acute soft tissue abnormality within the neck. Thyroid normal. No adenopathy. Upper chest: Visualized upper mediastinum within normal limits. Small layering left pleural effusion noted. Visualized lungs are otherwise clear. Mild emphysema noted. Review of the MIP images confirms the above findings CTA HEAD FINDINGS Anterior circulation: Petrous segments widely patent bilaterally. Scattered atheromatous plaque within the cavernous/ supraclinoid ICAs without significant stenosis. 3 mm focal outpouching arising from the supraclinoid left ICA compatible with a small P com type aneurysm (series 13, image 106). This is directed inferiorly and posteriorly. Right A1 segment patent. Left A1 segment hypoplastic but patent. Anterior communicating artery normal. Anterior cerebral arteries widely patent to their distal aspects. M1 segments patent without stenosis or occlusion. MCA bifurcations normal. Distal MCA branches well opacified and symmetric. Posterior circulation: Right vertebral artery slightly dominant. Multifocal atheromatous plaque within the right V4  segment with mild multifocal narrowing (series 11, image 147). Left vertebral artery widely patent to the vertebrobasilar junction. Posterior inferior cerebral arteries patent bilaterally. Basilar artery widely patent to its distal aspect. Superior cerebral arteries patent bilaterally. Both of the posterior cerebral arteries largely supplied via the basilar artery and are widely patent to their distal aspects. No aneurysm or vascular malformation seen underlying the pontine hematoma. Venous sinuses: Patent.  Anatomic variants: No significant anatomic variant. Delayed phase: No pathologic enhancement. Review of the MIP images confirms the above findings IMPRESSION: CTA NECK IMPRESSION: 1. Negative CTA of the head and neck. No flow limiting or critical stenosis identified. 2. Emphysema. CTA HEAD IMPRESSION: 1. Stable size of acute pontine hemorrhage with slightly increased localized vasogenic edema. There is mildly increased mass effect on the fourth ventricle posteriorly with slightly increased ventricular dilatation. No frank hydrocephalus. Continued close interval monitoring is warranted. 2. No aneurysm or other vascular abnormality identified underlying the acute pontine hemorrhage. 3. 3 mm left P com aneurysm. 4. Mild atheromatous plaque involving the cavernous ICAs and right V4 segment without high-grade stenosis. Electronically Signed   By: Rise Mu M.D.   On: 09/18/2016 06:59   Dg Chest Port 1 View  Result Date: 09/17/2016 CLINICAL DATA:  Leukocytosis. EXAM: PORTABLE CHEST 1 VIEW COMPARISON:  09/05/2016. FINDINGS: No tracheal tube terminates 3.3 cm above the carina. Nasogastric tube terminates in the stomach. Heart size stable. Lungs are somewhat low in volume with linear atelectasis at the lung bases bilaterally, left greater than right. No pleural fluid. IMPRESSION: Minimal bibasilar subsegmental atelectasis. Left lower lobe aeration has improved in the interval. Electronically Signed   By: Leanna Battles M.D.   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 Family aware of grime prognosis. Will admit to ICU. Repeat CT head 1400 today. Keep SBP <140. Will need to re-address goals of care after repeat imaging.   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 Current LOS is LOS 2 days  CODE STATUS    Code Status Orders        Start     Ordered   08/27/2016 0927  Full code  Continuous     08/29/2016 0932    Code Status History    Date Active Date Inactive Code Status Order ID Comments User Context   This patient has a current code status but no historical code status.        DISPO Keep in ICU      The  patient is critically ill with multiple organ systems failure and requires high complexity decision making for assessment and support, frequent evaluation and titration of therapies, application of advanced monitoring technologies and extensive interpretation of multiple databases.   Critical Care Time devoted to patient care services described in this note is  30  Minutes. This time reflects time of care of this signee Dr Kalman Shan. This critical care time does not reflect procedure time, or teaching time or supervisory time of PA/NP/Med student/Med Resident etc but could involve care discussion time    Dr. Kalman Shan, M.D., [facility]C.P Pulmonary and Critical Care Medicine Staff Physician [facility] [place] [facility] Pager:  [phone], If no answer or between  15:00h - 7:00h: call [phone]  09/18/2016 12:55 PM

## 2016-09-18 NOTE — Progress Notes (Signed)
CT reviewed Pontine hematoma fairly stable Ventricles remain small No role for CSF diversion at this point We will sign off, feel free to call with questions or if her needs change

## 2016-09-18 NOTE — Assessment & Plan Note (Signed)
Fever improving but wbc going up  Pl;an Check sepsi biomarker Start abx if neede

## 2016-09-18 NOTE — Assessment & Plan Note (Signed)
On cleviprex  Plan Per neuro

## 2016-09-18 NOTE — Assessment & Plan Note (Signed)
nsgy signed off due to stable hematoma Improved movement Still unresponsive  Plan Per neuro

## 2016-09-19 ENCOUNTER — Inpatient Hospital Stay (HOSPITAL_COMMUNITY): Payer: Medicare Other

## 2016-09-19 DIAGNOSIS — R509 Fever, unspecified: Secondary | ICD-10-CM | POA: Diagnosis not present

## 2016-09-19 DIAGNOSIS — J9601 Acute respiratory failure with hypoxia: Secondary | ICD-10-CM

## 2016-09-19 DIAGNOSIS — D72829 Elevated white blood cell count, unspecified: Secondary | ICD-10-CM | POA: Diagnosis not present

## 2016-09-19 DIAGNOSIS — I613 Nontraumatic intracerebral hemorrhage in brain stem: Principal | ICD-10-CM

## 2016-09-19 DIAGNOSIS — R6510 Systemic inflammatory response syndrome (SIRS) of non-infectious origin without acute organ dysfunction: Secondary | ICD-10-CM | POA: Diagnosis not present

## 2016-09-19 DIAGNOSIS — J9600 Acute respiratory failure, unspecified whether with hypoxia or hypercapnia: Secondary | ICD-10-CM | POA: Diagnosis not present

## 2016-09-19 DIAGNOSIS — I10 Essential (primary) hypertension: Secondary | ICD-10-CM | POA: Diagnosis not present

## 2016-09-19 LAB — CULTURE, RESPIRATORY W GRAM STAIN: Culture: NORMAL

## 2016-09-19 LAB — BASIC METABOLIC PANEL
ANION GAP: 10 (ref 5–15)
BUN: 6 mg/dL (ref 6–20)
CHLORIDE: 110 mmol/L (ref 101–111)
CO2: 21 mmol/L — AB (ref 22–32)
Calcium: 8.4 mg/dL — ABNORMAL LOW (ref 8.9–10.3)
Creatinine, Ser: 0.48 mg/dL (ref 0.44–1.00)
GFR calc Af Amer: >60 mL/min (ref >60)
GFR calc non Af Amer: >60 mL/min (ref >60)
GLUCOSE: 119 mg/dL — AB (ref 65–99)
POTASSIUM: 3.1 mmol/L — AB (ref 3.5–5.1)
Sodium: 141 mmol/L (ref 135–145)

## 2016-09-19 LAB — CBC
HEMATOCRIT: 39.9 % (ref 36.0–46.0)
HEMOGLOBIN: 13.1 g/dL (ref 12.0–15.0)
MCH: 25.1 pg — AB (ref 26.0–34.0)
MCHC: 32.8 g/dL (ref 30.0–36.0)
MCV: 76.6 fL — AB (ref 78.0–100.0)
Platelets: 261 10*3/uL (ref 150–400)
RBC: 5.21 MIL/uL — AB (ref 3.87–5.11)
RDW: 15.8 % — ABNORMAL HIGH (ref 11.5–15.5)
WBC: 22.9 10*3/uL — AB (ref 4.0–10.5)

## 2016-09-19 LAB — GLUCOSE, CAPILLARY
GLUCOSE-CAPILLARY: 143 mg/dL — AB (ref 65–99)
GLUCOSE-CAPILLARY: 135 mg/dL — AB (ref 65–99)
Glucose-Capillary: 124 mg/dL — ABNORMAL HIGH (ref 65–99)
Glucose-Capillary: 124 mg/dL — ABNORMAL HIGH (ref 65–99)

## 2016-09-19 LAB — PROCALCITONIN: PROCALCITONIN: 0.11 ng/mL

## 2016-09-19 LAB — MAGNESIUM
MAGNESIUM: 2.2 mg/dL (ref 1.7–2.4)
Magnesium: 2.1 mg/dL (ref 1.7–2.4)
Magnesium: 2.1 mg/dL (ref 1.7–2.4)

## 2016-09-19 LAB — CULTURE, RESPIRATORY

## 2016-09-19 LAB — PHOSPHORUS: Phosphorus: 1.8 mg/dL — ABNORMAL LOW (ref 2.5–4.6)

## 2016-09-19 LAB — TRIGLYCERIDES: TRIGLYCERIDES: 265 mg/dL — AB (ref <150)

## 2016-09-19 MED ORDER — POTASSIUM CHLORIDE 20 MEQ/15ML (10%) PO SOLN
30.0000 meq | ORAL | Status: AC
  Administered 2016-09-19 (×2): 30 meq
  Filled 2016-09-19 (×2): qty 30

## 2016-09-19 MED ORDER — SODIUM PHOSPHATES 45 MMOLE/15ML IV SOLN
20.0000 mmol | Freq: Once | INTRAVENOUS | Status: AC
  Administered 2016-09-19: 20 mmol via INTRAVENOUS
  Filled 2016-09-19: qty 6.67

## 2016-09-19 MED ORDER — VITAL HIGH PROTEIN PO LIQD
1000.0000 mL | ORAL | Status: DC
  Administered 2016-09-19: 1000 mL

## 2016-09-19 MED ORDER — VITAL AF 1.2 CAL PO LIQD
1000.0000 mL | ORAL | Status: DC
  Administered 2016-09-19 – 2016-09-25 (×7): 1000 mL
  Filled 2016-09-19 (×3): qty 1000

## 2016-09-19 MED ORDER — INSULIN ASPART 100 UNIT/ML ~~LOC~~ SOLN
0.0000 [IU] | SUBCUTANEOUS | Status: DC
  Administered 2016-09-19 (×3): 1 [IU] via SUBCUTANEOUS
  Administered 2016-09-20: 4 [IU] via SUBCUTANEOUS
  Administered 2016-09-20: 2 [IU] via SUBCUTANEOUS
  Administered 2016-09-20 (×2): 1 [IU] via SUBCUTANEOUS
  Administered 2016-09-20: 2 [IU] via SUBCUTANEOUS
  Administered 2016-09-21 (×5): 1 [IU] via SUBCUTANEOUS
  Administered 2016-09-22 (×2): 2 [IU] via SUBCUTANEOUS
  Administered 2016-09-22 (×2): 1 [IU] via SUBCUTANEOUS
  Administered 2016-09-22: 2 [IU] via SUBCUTANEOUS
  Administered 2016-09-23: 1 [IU] via SUBCUTANEOUS
  Administered 2016-09-23 – 2016-09-24 (×6): 2 [IU] via SUBCUTANEOUS
  Administered 2016-09-24: 1 [IU] via SUBCUTANEOUS
  Administered 2016-09-24 (×4): 2 [IU] via SUBCUTANEOUS
  Administered 2016-09-25 – 2016-09-26 (×6): 1 [IU] via SUBCUTANEOUS

## 2016-09-19 MED ORDER — PRO-STAT SUGAR FREE PO LIQD
30.0000 mL | Freq: Two times a day (BID) | ORAL | Status: DC
  Administered 2016-09-19: 30 mL
  Filled 2016-09-19: qty 30

## 2016-09-19 MED ORDER — AMLODIPINE BESYLATE 10 MG PO TABS
10.0000 mg | ORAL_TABLET | Freq: Every day | ORAL | Status: DC
  Administered 2016-09-19 – 2016-09-20 (×2): 10 mg via ORAL
  Filled 2016-09-19 (×3): qty 1

## 2016-09-19 NOTE — Progress Notes (Signed)
PT Cancellation Note  Patient Details Name: Chelsea Schaefer MRN: [id number] DOB: Jan 07, 1951   Cancelled Treatment:    Reason Eval/Treat Not Completed: Patient not medically ready Pt on bedrest. Will await increase in activity orders prior to PT evaluation. Thanks   Yony Roulston A Sy Saintjean 09/19/2016, 8:33 AM [name], PT, DPT [phone]

## 2016-09-19 NOTE — Progress Notes (Signed)
Pt placed on cpap/ps 10/5 but no pt effort noted. Pt placed back on FS at 0750. RN made aware. RT will continue to monitor.

## 2016-09-19 NOTE — Progress Notes (Signed)
[facility] Attending Note  Presenting HPI:  66 y.o.  female w/ no sig history. Was in usual health until am of admit. Pt awoke around 0700 went to RR. About 0730 she was found unresponsive on the floor. On arrival to ER she would wd to pain. Was hypertensive w/ SBP in 170-180s. CT head + for pontine ICH. PCCM asked to admit.  Subjective:  No acute events overnight. Patient was apneic on attempted pressure support wean this morning. Still not consistently following commands.  Review of Systems:  Unable to obtain given intubation & altered mental status.   Vent Mode: PRVC FiO2 (%):  [30 %] 30 % Set Rate:  [15 bmp] 15 bmp Vt Set:  [440 mL] 440 mL PEEP:  [5 cmH20] 5 cmH20 Plateau Pressure:  [17 cmH20-18 cmH20] 17 cmH20  Temp:  [98.4 F (36.9 C)-101.5 F (38.6 C)] 99.3 F (37.4 C) (02/26 1200) Pulse Rate:  [73-101] 101 (02/26 1105) Resp:  [10-28] 15 (02/26 1400) BP: (107-160)/(50-84) 132/67 (02/26 1400) SpO2:  [97 %-100 %] 100 % (02/26 1400) FiO2 (%):  [30 %] 30 % (02/26 1105)  General:  Family member at bedside. Intubated. No distress. Integument:  Warm & dry. No rash on exposed skin. HEENT:  Moist mucus memebranes. No scleral icterus. Endotracheal tube in place. Neurological:  Pupils symmetric. No spontaneous movements. Not following commands or opening eyes to voice. Not currently on sedation. Musculoskeletal:  No joint effusion or erythema appreciated. Symmetric muscle bulk. Pulmonary:  Symmetric chest wall rise on ventilator. Clear breath sounds bilaterally. Cardiovascular:  Regular rate & rhythm. No appreciable JVD. Normal S1 & S2. Telemetry:  Sinus rhythm. Abdomen:  Soft. Nondistended. Normoactive bowel sounds.  LINES/TUBES: OETT 7.5 2/23 >>> Foley 2/25 >>> OGT 2/23 >>> PIV  CBC Latest Ref Rng & Units 09/19/2016 09/18/2016 09/17/2016  WBC 4.0 - 10.5 K/uL 22.9(H) 27.0(H) 20.5(H)  Hemoglobin 12.0 - 15.0 g/dL 13.1 14.2 13.3  Hematocrit 36.0 - 46.0 % 39.9  41.9 40.4  Platelets 150 - 400 K/uL 261 297 391   BMP Latest Ref Rng & Units 09/19/2016 09/18/2016 09/17/2016  Glucose 65 - 99 mg/dL 119(H) 121(H) 138(H)  BUN 6 - 20 mg/dL _0 Creatinine 0.44 - 1.00 mg/dL 0.48 0.47 0.58  Sodium 135 - 145 mmol/L 141 139 138  Potassium 3.5 - 5.1 mmol/L 3.1(L) 4.9 3.5  Chloride 101 - 111 mmol/L 110 107 105  CO2 22 - 32 mmol/L 21(L) 17(L) 20(L)  Calcium 8.9 - 10.3 mg/dL 8.4(L) 8.6(L) 8.8(L)   Hepatic Function Latest Ref Rng & Units 08/28/2016  Total Protein 6.5 - 8.1 g/dL 7.7  Albumin 3.5 - 5.0 g/dL 3.6  AST 15 - 41 U/L 26  ALT 14 - 54 U/L 23  Alk Phosphatase 38 - 126 U/L 62  Total Bilirubin 0.3 - 1.2 mg/dL 0.7    IMAGING/STUDIES: CT HEAD W/O 2/23:  Central pontine hematoma is essentially size stable from earlier today. Contiguous clot dissecting in the central midbrain and along the fourth ventricular floor is mildly increased. Patent fourth ventricle and no hydrocephalus. Chronic microvascular disease. TTE 2/24: Mild LVH with normal cavity size, EF 65-70%, and grade 1 diastolic dysfunction. No aortic stenosis or regurgitation. Aortic root normal in size. No mitral stenosis or regurgitation. No pulmonic regurgitation or stenosis. Trivial tricuspid regurgitation without stenosis. No pericardial effusion. CTA HEAD/NECK 2/25:  Negative CTA of the head and neck. No flow limiting or critical stenosis identified. Emphysema. Stable size  of acute pontine hemorrhage with slightly increased localized vasogenic edema. There is mildly increased mass effect on the fourth ventricle posteriorly with slightly increased ventricular dilatation. No frank hydrocephalus. 3 mm left P com aneurysm. CT HEAD W/O 2/26:  Size stable brainstem hematoma.  Stable normal ventricular volume. PORT CXR 2/26:  Personally reviewed by me. Low lung volumes. No focal opacity appreciated. Endotracheal and enteric feeding tubes in good position.  MICROBIOLOGY: MRSA PCR 2/23:  Negative  Blood  Cultures x2 2/24 >>> Tracheal Aspirate Culture 2/24 >>>  ANTIBIOTICS: None.  SIGNIFICANT EVENTS: 02/23 - Admit  ASSESSMENT/PLAN:  66 y.o. female with acute Central pontine hemorrhage and hypertensive emergency. Patient exhibiting no spontaneous respirations and with a poor neurological exam.  1. Acute, nontraumatic central pontine intracerebral hemorrhage: Management per neurology. Previously evaluated by neurosurgery and no indication for intraventricular drain placement. 2. Hypertensive emergency: Continuing Cleviprex. Vitals per unit protocol. Blood pressure goal per neurology and neurosurgery. Continuing Norvasc & Cozaar.  3. Acute encephalopathy: Likely secondary to intracerebral hemorrhage. Continuing to minimize sedation. 4. Acute hypoxic respiratory failure: Lack of spontaneous respirations on pressure support wean. Likely will need tracheostomy placement. 5. SIRS: Multifactorial. Cultures pending. Holding on empiric antibiotics at this time. Trending Procalcitonin per algorithm. 6. Hypokalemia:  S/P KCl 84mq IV. Repeat electrolytes in the morning.  7. Hyperglycemia:  Hgb A1C 5.9 on 2/24. Starting Accu-Cheks every 4 hours and sliding scale insulin per low-dose protocol.  Prophylaxis: Heparin subcutaneous every 8 hours, SCDs, & Protonix 40 mg via tube daily. Diet: Nothing by mouth. Tube feedings per dietary recommendations. Code Status: Full code per previous physician discussion. Disposition: Patient remains critically ill endotracheally intubated in the intensive care unit. Family Update: Family member updated at bedside rounds by myself.  I have personally spent a total of 31 minutes of critical care time today caring for the patient, updating her family member today, & reviewing the aptient's electronic medical record.  JSonia BallerNAshok Cordia M.D. [facility] Pager:  [phone] 3pm or if no response, call [phone] 2:33 PM 09/19/16

## 2016-09-19 NOTE — Progress Notes (Signed)
OT Cancellation Note  Patient Details Name: Donella Stadeu Thi Hughart MRN: [id number] DOB: 14-Jul-1951   Cancelled Treatment:    Reason Eval/Treat Not Completed: Other (comment). Pt on bedrest. Please update activity orders when appropriate. Thanks  [facility] LLC[facility]ILLARY  Kooper Chriswell, OT/L  [phone] 09/19/2016 09/19/2016, 7:36 AM

## 2016-09-19 NOTE — Progress Notes (Signed)
[facility]OCOL FOR AM LAB REPLACEMENT ONLY  The patient does apply for [facility] Electrolyte [facility] Protocol based on the criteria listed below:   1. Is GFR >/= 40 ml/min? Yes.    Patient's GFR today is >60 2. Is urine output >/= 0.5 ml/kg/hr for the last 6 hours? Yes.   Patient's UOP is 1.6 ml/kg/hr 3. Is BUN < 60 mg/dL? Yes.    Patient's BUN today is 6 4. Abnormal electrolyte  K 3.1, Phosphate 1.8 5. Ordered repletion with: per protocol 6. If a panic level lab has been reported, has the CCM MD in charge been notified? Yes.  .   Physician:  Tonny BranchSommer  Kateland Leisinger, Lang Snowlizabeth McEachran 09/19/2016 5:48 AM

## 2016-09-19 NOTE — Progress Notes (Signed)
RT unable to move the tube due to tongue swelling noted. RN aware

## 2016-09-19 NOTE — Progress Notes (Signed)
STROKE TEAM PROGRESS NOTE   SUBJECTIVE (INTERVAL HISTORY) Her son is at the bedside.  Pt still intubated not on sedation, not open eyes on voice. Not breathing over the vent. As per son, pt not as active as yesterday, not moving much overnight. Repeat CT head stable hematoma and no hydrocephalus.    OBJECTIVE Temp:  [98.4 F (36.9 C)-101.5 F (38.6 C)] 99.3 F (37.4 C) (02/26 1200) Pulse Rate:  [73-101] 101 (02/26 1105) Cardiac Rhythm: Normal sinus rhythm (02/26 0800) Resp:  [10-28] 15 (02/26 1400) BP: (107-160)/(50-84) 132/67 (02/26 1400) SpO2:  [97 %-100 %] 100 % (02/26 1400) FiO2 (%):  [30 %] 30 % (02/26 1105)  CBC:   Recent Labs Lab 2016/09/25 0806  09/18/16 0338 09/19/16 0258  WBC 17.3*  < > 27.0* 22.9*  NEUTROABS 6.9  --   --   --   HGB 13.7  < > 14.2 13.1  HCT 42.1  < > 41.9 39.9  MCV 78.3  < > 76.5* 76.6*  PLT 358  < > 297 261  < > = values in this interval not displayed.  Basic Metabolic Panel:   Recent Labs Lab 09/18/16 0338 09/19/16 0258 09/19/16 0949  NA 139 141  --   K 4.9 3.1*  --   CL 107 110  --   CO2 17* 21*  --   GLUCOSE 121* 119*  --   BUN 6 6  --   CREATININE 0.47 0.48  --   CALCIUM 8.6* 8.4*  --   MG  --  2.1 2.1  PHOS  --  1.8*  --     Lipid Panel:     Component Value Date/Time   CHOL 323 (H) 25-Sep-2016 0806   TRIG 265 (H) 09/19/2016 0258   HDL 37 (L) 09-25-16 0806   CHOLHDL 8.7 2016/09/25 0806   VLDL 45 (H) 09/25/16 0806   LDLCALC 241 (H) September 25, 2016 0806   HgbA1c:  Lab Results  Component Value Date   HGBA1C 5.9 (H) 09/17/2016   Urine Drug Screen:     Component Value Date/Time   LABOPIA NONE DETECTED 25-Sep-2016 1930   COCAINSCRNUR NONE DETECTED 09-25-2016 1930   LABBENZ NONE DETECTED 25-Sep-2016 1930   AMPHETMU NONE DETECTED 2016-09-25 1930   THCU NONE DETECTED 2016/09/25 1930   LABBARB NONE DETECTED 09/25/16 1930      IMAGING I have personally reviewed the radiological images below and agree with the radiology  interpretations.  Ct Head Wo Contrast 25-Sep-2016 1. Central pontine hematoma is essentially size stable from earlier today. Contiguous clot dissecting in the central midbrain and along the fourth ventricular floor is mildly increased. Patent fourth ventricle and no hydrocephalus.  2. Chronic microvascular disease.   Dg Chest Portable 1 View 09/25/2016 1. Endotracheal tube tip at the right mainstem bronchus.  2. Left lower lobe atelectasis could be from #1 or aspiration.   Ct Head Code Stroke W/o Cm 2016-09-25 1. 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 Early extension into the left cerebellar peduncle and area postrema. No intraventricular or subarachnoid extension. Patent basilar cisterns. No significant intracranial mass effect at this time.  2. Underlying chronic small vessel disease.  3. ASPECTS is not applicable; acute hemorrhage.   TTE  09/17/2016 Study Conclusions - Left ventricle: The cavity size was normal. Wall thickness was   increased in a pattern of mild LVH. Systolic function was   vigorous. The estimated ejection fraction was in the range of 65%  to 70%.    Wall motion was normal; there were no regional wall   motion abnormalities. Doppler parameters are consistent with   abnormal left ventricular relaxation (grade 1 diastolic   dysfunction). - Aortic valve: Valve area (VTI): 1.77 cm^2. Valve area (Vmax):   1.37 cm^2. Valve area (Vmean): 1.79 cm^2. - Atrial septum: No defect or patent foramen ovale was identified. - Technically adequate study.  CTA Head and Neck 09/18/2016  CTA NECK  1. Negative CTA of the head and neck. No flow limiting or critical stenosis identified. 2. Emphysema.  CTA HEAD  1. Stable size of acute pontine hemorrhage with slightly increased localized vasogenic edema.  There is mildly increased mass effect on the fourth ventricle posteriorly with slightly increased ventricular dilatation. No frank hydrocephalus.  Continued close interval monitoring is warranted. 2. No aneurysm or other vascular abnormality identified underlying the acute pontine hemorrhage. 3. 3 mm left P com aneurysm. 4. Mild atheromatous plaque involving the cavernous ICAs and right V4 segment without high-grade stenosis.  Ct Head Wo Contrast 09/19/2016 IMPRESSION: Size stable brainstem hematoma.  Stable normal ventricular volume.   Dg Chest Port 1 View 09/19/2016 IMPRESSION: 1. Lines and tubes in stable position. 2. Persistent bibasilar subsegmental atelectasis and small left pleural effusion. No significant change.     PHYSICAL EXAM  Temp:  [98.4 F (36.9 C)-101.5 F (38.6 C)] 99.3 F (37.4 C) (02/26 1200) Pulse Rate:  [73-101] 101 (02/26 1105) Resp:  [10-28] 15 (02/26 1400) BP: (107-160)/(50-84) 132/67 (02/26 1400) SpO2:  [97 %-100 %] 100 % (02/26 1400) FiO2 (%):  [30 %] 30 % (02/26 1105)  General - Well nourished, well developed, intubated not on sedation.  Ophthalmologic - Fundi not visualized.  Cardiovascular - Regular rate and rhythm.  Neuro - intubated off sedation, not open eyes on voice or pain. Pupils bilateral 2mm, briskly reactive to light. sluggish doll's eyes, no corneal. Positive gag and cough. On pain stimulation, LUE no significant movement, BLE extension and RUE mild withdraw not against gravity. Babinski negative bilaterally. Sensation, coordination or gait not tested.    ASSESSMENT/PLAN [name] is a 66 y.o. female with history of hypertension and arthritis presenting with unresponsiveness. She did not receive IV t-PA due to hemorrhagic stroke.  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 Worsening exam today likely due to local edema and fever.   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 No AVM. 3mm left  PCOM aneurysm  2D Echo - EF 65-70%. No cardiac source of emboli identified.  LDL - 241  HgbA1c - 5.9  VTE prophylaxis - heparin subq  Diet NPO time specified  No antithrombotic prior to admission, now on No antithrombotic  Ongoing aggressive stroke risk factor management  Therapy recommendations: pending  Disposition: Pending  Fever and leukocystosis  Intermittent low grade fever   On cooling blanket  WBC 17.3->20.5 -> 27-> 22.7  UA negative  CXR - atelectasis  B Cx - pending  Sputum Cx - pending  May consider Abx empiric treatment, defer to CCM  Respiratory failure  Due to pontine ICH  On vent  CCM on board  Not breathing over the vent  May need to consider trach earlier  Hypertension  Stable - IV Cleviprex  Continue Losartan to 50mg  bid, off cleviprex as able  Add amlodipine 10mg   BP goal < 140  Long-term BP goal normotensive  Hyperlipidemia  Home meds:  No lipid lowering medications PTA  LDL 241, goal < 70  Add lipitor 40mg  daily.  Continue statin at discharge  Other Stroke Risk Factors  Advanced age  Other Active Problems  Intubated  3 mm left P com aneurysm.  Hospital day # 3  This patient is critically ill due to brainstem ICH, fever, leukocytosis, HLD and at significant risk of neurological worsening, death form recurrent ICH, brain herniation, hydrocephalus. This patient's care requires constant monitoring of vital signs, hemodynamics, respiratory and cardiac monitoring, review of multiple databases, neurological assessment, discussion with family, other specialists and medical decision making of high complexity. I spent 40 minutes of neurocritical care time in the care of this patient. I had long discussion with son at bedside, updated him about pt condition, treatment options and possible prognosis. Her worsening exam likely due to local edema and fever.   Marvel PlanJindong Suki Crockett, MD PhD Stroke Neurology 09/19/2016 2:12 PM    To  contact Stroke Continuity provider, please refer to [email or web address]. After hours, contact General Neurology

## 2016-09-19 NOTE — Progress Notes (Signed)
Transported pt to and from 3M09 to CT2 on ventilator. Pt stable throughout with no complications.  VS within normal limits. Pt returned to 30% fio2 upon arrival back to unit. RT will continue to monitor.

## 2016-09-19 NOTE — Progress Notes (Signed)
Nutrition Follow-up  DOCUMENTATION CODES:   Not applicable  INTERVENTION:  - Discontinue Vital High Protein TF and Pro-Stat BID regimen - Initiate TF of Vital AF 1.2 at 55 mL/hr (1320 mL daily) - Providing 1584 calories, 99 grams protein and 1069 mL free water daily   NUTRITION DIAGNOSIS:   Inadequate oral intake related to inability to eat as evidenced by NPO status.  GOAL:   Patient will meet greater than or equal to 90% of their needs  MONITOR:   Vent status, Labs, Weight trends, I & O's  REASON FOR ASSESSMENT:   Consult Enteral/tube feeding initiation and management  ASSESSMENT:   66 y.o. female who was found unresponsive at home. She was transported to the hospital. CT head shows pontine hemorrhage. She is current intubated. Family is not present at bedside. Nursing reports she does not speak English per family.  Discussed pt with RN. Pt is currently on Clevipex but plan to wean.  Pt is currently intubated on ventilator support. MV: 6.7 mL/hr Temp (24hrs), Avg:99.7 F (37.6 C), Min:98.4 F (36.9 C), Max:101.5 F (38.6 C)  Per chart review no recent weight history recorded. 11 months ago pt weights 140 lb, 3 lb weight gain in ~ 1 year (not significant for time frame)  Labs reviewed; CBG 143, K 3.1, Phosphorous 1.8- Noted low, being monitored q 12 hr per protocol Medications reviewed; Protonix, Senokot-S Nutrition-Focused physical exam completed. Findings are no fat depletion, no muscle depletion, and no edema.   Diet Order:  Diet NPO time specified  Skin:  Reviewed, no issues  Last BM:  PTA  Height:   Ht Readings from Last 1 Encounters:  09/07/2016 5\' 2"  (1.575 m)    Weight:   Wt Readings from Last 1 Encounters:  08/29/2016 143 lb 8.3 oz (65.1 kg)    Ideal Body Weight:  50 kg  BMI:  Body mass index is 26.25 kg/m.  Estimated Nutritional Needs:   Kcal:  1545  Protein:  85-100 grams (1.3-1.5 g/kg)  Fluid:  >/= 1.5 L/d  EDUCATION NEEDS:   No  education needs identified at this time  Fransisca KaufmannAllison Ioannides Dietetic Intern

## 2016-09-20 DIAGNOSIS — I10 Essential (primary) hypertension: Secondary | ICD-10-CM | POA: Diagnosis not present

## 2016-09-20 DIAGNOSIS — D72829 Elevated white blood cell count, unspecified: Secondary | ICD-10-CM | POA: Diagnosis not present

## 2016-09-20 DIAGNOSIS — R509 Fever, unspecified: Secondary | ICD-10-CM | POA: Diagnosis not present

## 2016-09-20 DIAGNOSIS — R6510 Systemic inflammatory response syndrome (SIRS) of non-infectious origin without acute organ dysfunction: Secondary | ICD-10-CM | POA: Diagnosis not present

## 2016-09-20 DIAGNOSIS — J9601 Acute respiratory failure with hypoxia: Secondary | ICD-10-CM | POA: Diagnosis not present

## 2016-09-20 DIAGNOSIS — I613 Nontraumatic intracerebral hemorrhage in brain stem: Secondary | ICD-10-CM | POA: Diagnosis not present

## 2016-09-20 LAB — CBC WITH DIFFERENTIAL/PLATELET
Basophils Absolute: 0 10*3/uL (ref 0.0–0.1)
Basophils Relative: 0 %
Eosinophils Absolute: 0.2 10*3/uL (ref 0.0–0.7)
Eosinophils Relative: 1 %
HCT: 40.5 % (ref 36.0–46.0)
HEMOGLOBIN: 13.4 g/dL (ref 12.0–15.0)
LYMPHS ABS: 3 10*3/uL (ref 0.7–4.0)
LYMPHS PCT: 16 %
MCH: 25.2 pg — AB (ref 26.0–34.0)
MCHC: 33.1 g/dL (ref 30.0–36.0)
MCV: 76.3 fL — ABNORMAL LOW (ref 78.0–100.0)
Monocytes Absolute: 1 10*3/uL (ref 0.1–1.0)
Monocytes Relative: 5 %
Neutro Abs: 14.4 10*3/uL — ABNORMAL HIGH (ref 1.7–7.7)
Neutrophils Relative %: 78 %
Platelets: 339 10*3/uL (ref 150–400)
RBC: 5.31 MIL/uL — AB (ref 3.87–5.11)
RDW: 15.8 % — ABNORMAL HIGH (ref 11.5–15.5)
WBC: 18.6 10*3/uL — AB (ref 4.0–10.5)

## 2016-09-20 LAB — RENAL FUNCTION PANEL
ANION GAP: 9 (ref 5–15)
Albumin: 2.8 g/dL — ABNORMAL LOW (ref 3.5–5.0)
BUN: 9 mg/dL (ref 6–20)
CHLORIDE: 106 mmol/L (ref 101–111)
CO2: 24 mmol/L (ref 22–32)
Calcium: 8.7 mg/dL — ABNORMAL LOW (ref 8.9–10.3)
Creatinine, Ser: 0.43 mg/dL — ABNORMAL LOW (ref 0.44–1.00)
GFR calc non Af Amer: >60 mL/min (ref >60)
Glucose, Bld: 172 mg/dL — ABNORMAL HIGH (ref 65–99)
Phosphorus: 1.3 mg/dL — ABNORMAL LOW (ref 2.5–4.6)
Potassium: 3.3 mmol/L — ABNORMAL LOW (ref 3.5–5.1)
Sodium: 139 mmol/L (ref 135–145)

## 2016-09-20 LAB — GLUCOSE, CAPILLARY
GLUCOSE-CAPILLARY: 132 mg/dL — AB (ref 65–99)
GLUCOSE-CAPILLARY: 176 mg/dL — AB (ref 65–99)
GLUCOSE-CAPILLARY: 125 mg/dL — AB (ref 65–99)
Glucose-Capillary: 138 mg/dL — ABNORMAL HIGH (ref 65–99)
Glucose-Capillary: 134 mg/dL — ABNORMAL HIGH (ref 65–99)
Glucose-Capillary: 158 mg/dL — ABNORMAL HIGH (ref 65–99)

## 2016-09-20 LAB — PATHOLOGIST SMEAR REVIEW

## 2016-09-20 LAB — MAGNESIUM
Magnesium: 2.1 mg/dL (ref 1.7–2.4)
Magnesium: 2.1 mg/dL (ref 1.7–2.4)

## 2016-09-20 LAB — PROCALCITONIN: Procalcitonin: 0.19 ng/mL

## 2016-09-20 MED ORDER — POTASSIUM CHLORIDE 20 MEQ/15ML (10%) PO SOLN
40.0000 meq | Freq: Once | ORAL | Status: AC
  Administered 2016-09-20: 40 meq
  Filled 2016-09-20: qty 30

## 2016-09-20 MED ORDER — CARVEDILOL 6.25 MG PO TABS
6.2500 mg | ORAL_TABLET | Freq: Two times a day (BID) | ORAL | Status: DC
  Administered 2016-09-20 – 2016-09-21 (×3): 6.25 mg via ORAL
  Filled 2016-09-20 (×2): qty 2
  Filled 2016-09-20: qty 1

## 2016-09-20 NOTE — Progress Notes (Signed)
[facility] Rounding Note  Presenting HPI:  66 y.o.  female w/ no sig history. Was in usual health until am of admit. Pt awoke around 0700 went to RR. About 0730 she was found unresponsive on the floor. On arrival to ER she would withdrawal to pain. Was hypertensive w/ SBP in 170-180s. CT head + for pontine ICH. PCCM asked to admit.  Subjective:  No acute events overnight. CT yesterday stable  Objective:  Vent Mode: PRVC FiO2 (%):  [30 %] 30 % Set Rate:  [15 bmp] 15 bmp Vt Set:  [440 mL] 440 mL PEEP:  [5 cmH20] 5 cmH20 Plateau Pressure:  [14 cmH20-20 cmH20] 14 cmH20  Temp:  [98.2 F (36.8 C)-100.5 F (38.1 C)] 98.2 F (36.8 C) (02/27 0600) Pulse Rate:  [80-101] 80 (02/27 0745) Resp:  [13-28] 18 (02/27 0900) BP: (116-164)/(58-92) 148/66 (02/27 0900) SpO2:  [96 %-100 %] 99 % (02/27 0900) FiO2 (%):  [30 %] 30 % (02/27 0745) Weight:  [69.5 kg (153 lb 3.5 oz)] 69.5 kg (153 lb 3.5 oz) (02/27 0407)  Physical Exam: General:  Female of normal body habitus appears comfortable in bed Neuro:  Unresponsive on vent to pain. PERRL, +gag.  HEENT:  [place]/AT, No JVD noted, PERRL Cardiovascular:  RRR, no MRG Lungs:  Clear bilateral breath sounds Abdomen:  Soft, non-distended Musculoskeletal:  No acute deformity Skin:  Intact, MMM  LINES/TUBES: OETT 7.5 2/23 >>> Foley 2/25 >>> OGT 2/23 >>> PIV  CBC Latest Ref Rng & Units 09/20/2016 09/19/2016 09/18/2016  WBC 4.0 - 10.5 K/uL 18.6(H) 22.9(H) 27.0(H)  Hemoglobin 12.0 - 15.0 g/dL 13.4 13.1 14.2  Hematocrit 36.0 - 46.0 % 40.5 39.9 41.9  Platelets 150 - 400 K/uL 339 261 297   BMP Latest Ref Rng & Units 09/20/2016 09/19/2016 09/18/2016  Glucose 65 - 99 mg/dL 172(H) 119(H) 121(H)  BUN 6 - 20 mg/dL 9 6 6   Creatinine 0.44 - 1.00 mg/dL 0.43(L) 0.48 0.47  Sodium 135 - 145 mmol/L 139 141 139  Potassium 3.5 - 5.1 mmol/L 3.3(L) 3.1(L) 4.9  Chloride 101 - 111 mmol/L 106 110 107  CO2 22 - 32 mmol/L 24 21(L) 17(L)  Calcium 8.9 - 10.3  mg/dL 8.7(L) 8.4(L) 8.6(L)   Hepatic Function Latest Ref Rng & Units 09/20/2016 09/15/2016  Total Protein 6.5 - 8.1 g/dL - 7.7  Albumin 3.5 - 5.0 g/dL 2.8(L) 3.6  AST 15 - 41 U/L - 26  ALT 14 - 54 U/L - 23  Alk Phosphatase 38 - 126 U/L - 62  Total Bilirubin 0.3 - 1.2 mg/dL - 0.7    IMAGING/STUDIES: CT HEAD W/O 2/23:  Central pontine hematoma is essentially size stable from earlier today. Contiguous clot dissecting in the central midbrain and along the fourth ventricular floor is mildly increased. Patent fourth ventricle and no hydrocephalus. Chronic microvascular disease. TTE 2/24: Mild LVH with normal cavity size, EF 65-70%, and grade 1 diastolic dysfunction. No aortic stenosis or regurgitation. Aortic root normal in size. No mitral stenosis or regurgitation. No pulmonic regurgitation or stenosis. Trivial tricuspid regurgitation without stenosis. No pericardial effusion. CTA HEAD/NECK 2/25:  Negative CTA of the head and neck. No flow limiting or critical stenosis identified. Emphysema. Stable size of acute pontine hemorrhage with slightly increased localized vasogenic edema. There is mildly increased mass effect on the fourth ventricle posteriorly with slightly increased ventricular dilatation. No frank hydrocephalus. 3 mm left P com aneurysm. CT HEAD W/O 2/26:  Size stable brainstem hematoma.  Stable normal ventricular volume. PORT CXR 2/26:  Personally reviewed by me. Low lung volumes. No focal opacity appreciated. Endotracheal and enteric feeding tubes in good position.  MICROBIOLOGY: MRSA PCR 2/23:  Negative  Blood Cultures x2 2/24 >>> Tracheal Aspirate Culture 2/24 >>>  ANTIBIOTICS: None.  SIGNIFICANT EVENTS: 02/23 - Admit  ASSESSMENT/PLAN:  66 y.o. female with acute central pontine hemorrhage and hypertensive emergency. Patient continues to exhibit no spontaneous respirations and exam remains poor.   Acute, nontraumatic central pontine intracerebral hemorrhage: Neurosurgery - no  indication for intraventricular drain placement. - Management per neurology, prognosis poor.  Hypertensive emergency:  - Continue cleviprex. Remains max dose - BP goal per neurology (<172mHg) - Continue amlodipine and losartan per tube - Will add coreg 6.26mBID in attempt to wean drip  Acute hypoxic respiratory failure: apneic on SBT - Will require tracheostomy if family wishes to be aggressive. Will revisit this with son later in week.   SIRS: Multifactorial. Procalcitonin remains < 0.25 -No significant growth to date on cultures, continue to follow -Hold empiric ABX for now -Continue to trend PCT  Hypokalemia:   - Replete 4040mper tube  Hyperglycemia:  Hgb A1C 5.9 on 2/24.  - Continue Accuchecks q 4 hours and SSI  Prophylaxis: SQ heparin and Protonix IV Diet: Tube feeds per dietary Code Status: Full code per previous physician discussion. Disposition: Critically ill in ICU Family Update: Son updated at length by Dr. Xu Erlinda Hongis AM  PauGeorgann HousekeeperGACNP-BC [facility]BNorth [facility] Hospit[facility]monology/Critical Care Pager [phone] ([phone]/27/2018 10:04 AM

## 2016-09-20 NOTE — Progress Notes (Signed)
OT Cancellation Note  Patient Details Name: Donella Stadeu Thi Burbage MRN: [id number] DOB: 1950-11-14   Cancelled Treatment:    Reason Eval/Treat Not Completed: Patient not medically ready. Pt remains of bedrest. Please update activity orders when appropriate for therapy. Thanks  [facility] Care[facility]  Angelus Hoopes, OT/L  [phone] 09/20/2016 09/20/2016, 7:42 AM

## 2016-09-20 NOTE — Progress Notes (Signed)
SLP Cancellation Note  Patient Details Name: Chelsea Schaefer MRN: [id number] DOB: 1950-09-02   Cancelled treatment:       Reason Eval/Treat Not Completed: Patient not medically ready, remains intubated and unresponsive per chart review with consideration for trach. SLP to sign off now - please re-order when ready.   Maxcine Hamaiewonsky, Sherryn Pollino 09/20/2016, 8:43 AM  Maxcine HamLaura Paiewonsky, M.A. CCC-SLP [phone]

## 2016-09-20 NOTE — Progress Notes (Signed)
STROKE TEAM PROGRESS NOTE   SUBJECTIVE (INTERVAL HISTORY) Son and daughter at bedside. Patient has more movement on exam today. Fever 100.5 this morning. WBC count improving. Discussed potential need for trach with son. He would like to wait a few more days and discuss with his family.    OBJECTIVE Temp:  [98.2 F (36.8 C)-100.5 F (38.1 C)] 98.2 F (36.8 C) (02/27 0600) Pulse Rate:  [91-101] 91 (02/26 1610) Cardiac Rhythm: (P) Normal sinus rhythm;Sinus tachycardia (02/27 0744) Resp:  [13-28] 15 (02/27 0700) BP: (116-164)/(58-92) 129/66 (02/27 0700) SpO2:  [96 %-100 %] 99 % (02/27 0700) FiO2 (%):  [30 %] 30 % (02/27 0700) Weight:  [153 lb 3.5 oz (69.5 kg)] 153 lb 3.5 oz (69.5 kg) (02/27 0407)  CBC:   Recent Labs Lab 09/08/2016 0806  09/19/16 0258 09/20/16 0421  WBC 17.3*  < > 22.9* 18.6*  NEUTROABS 6.9  --   --  14.4*  HGB 13.7  < > 13.1 13.4  HCT 42.1  < > 39.9 40.5  MCV 78.3  < > 76.6* 76.3*  PLT 358  < > 261 339  < > = values in this interval not displayed.  Basic Metabolic Panel:   Recent Labs Lab 09/19/16 0258  09/19/16 1629 09/20/16 0421  NA 141  --   --  139  K 3.1*  --   --  3.3*  CL 110  --   --  106  CO2 21*  --   --  24  GLUCOSE 119*  --   --  172*  BUN 6  --   --  9  CREATININE 0.48  --   --  0.43*  CALCIUM 8.4*  --   --  8.7*  MG 2.1  < > 2.2 2.1  PHOS 1.8*  --   --  1.3*  < > = values in this interval not displayed.  Lipid Panel:     Component Value Date/Time   CHOL 323 (H) 09/19/2016 0806   TRIG 265 (H) 09/19/2016 0258   HDL 37 (L) 09/06/2016 0806   CHOLHDL 8.7 09/15/2016 0806   VLDL 45 (H) 09/15/2016 0806   LDLCALC 241 (H) 09/18/2016 0806   HgbA1c:  Lab Results  Component Value Date   HGBA1C 5.9 (H) 09/17/2016   Urine Drug Screen:     Component Value Date/Time   LABOPIA NONE DETECTED 09/15/2016 1930   COCAINSCRNUR NONE DETECTED 09/09/2016 1930   LABBENZ NONE DETECTED 09/01/2016 1930   AMPHETMU NONE DETECTED 08/26/2016 1930   THCU NONE DETECTED 09/19/2016 1930   LABBARB NONE DETECTED 09/14/2016 1930      IMAGING I have personally reviewed the radiological images below and agree with the radiology interpretations.  Ct Head Wo Contrast 09/01/2016 1. Central pontine hematoma is essentially size stable from earlier today. Contiguous clot dissecting in the central midbrain and along the fourth ventricular floor is mildly increased. Patent fourth ventricle and no hydrocephalus.  2. Chronic microvascular disease.   Dg Chest Portable 1 View 09/20/2016 1. Endotracheal tube tip at the right mainstem bronchus.  2. Left lower lobe atelectasis could be from #1 or aspiration.   Ct Head Code Stroke W/o Cm 09/05/2016 1. 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 Early extension into the left cerebellar peduncle and area postrema. No intraventricular or subarachnoid extension. Patent basilar cisterns. No significant intracranial mass effect at this time.  2. Underlying chronic small vessel disease.  3. ASPECTS is not  applicable; acute hemorrhage.   TTE  09/17/2016 Study Conclusions - Left ventricle: The cavity size was normal. Wall thickness was   increased in a pattern of mild LVH. Systolic function was   vigorous. The estimated ejection fraction was in the range of 65% to 70%.    Wall motion was normal; there were no regional wall   motion abnormalities. Doppler parameters are consistent with   abnormal left ventricular relaxation (grade 1 diastolic   dysfunction). - Aortic valve: Valve area (VTI): 1.77 cm^2. Valve area (Vmax):   1.37 cm^2. Valve area (Vmean): 1.79 cm^2. - Atrial septum: No defect or patent foramen ovale was identified. - Technically adequate study.  CTA Head and Neck 09/18/2016  CTA NECK  1. Negative CTA of the head and neck. No flow limiting or critical stenosis identified. 2. Emphysema.  CTA HEAD  1. Stable size of acute pontine hemorrhage with slightly increased  localized vasogenic edema.  There is mildly increased mass effect on the fourth ventricle posteriorly with slightly increased ventricular dilatation. No frank hydrocephalus. Continued close interval monitoring is warranted. 2. No aneurysm or other vascular abnormality identified underlying the acute pontine hemorrhage. 3. 3 mm left P com aneurysm. 4. Mild atheromatous plaque involving the cavernous ICAs and right V4 segment without high-grade stenosis.  Ct Head Wo Contrast 09/19/2016 IMPRESSION: Size stable brainstem hematoma.  Stable normal ventricular volume.   Dg Chest Port 1 View 09/19/2016 IMPRESSION: 1. Lines and tubes in stable position. 2. Persistent bibasilar subsegmental atelectasis and small left pleural effusion. No significant change.     PHYSICAL EXAM  Temp:  [98.2 F (36.8 C)-100.5 F (38.1 C)] 98.2 F (36.8 C) (02/27 0600) Pulse Rate:  [91-101] 91 (02/26 1610) Resp:  [13-28] 15 (02/27 0700) BP: (116-164)/(58-92) 129/66 (02/27 0700) SpO2:  [96 %-100 %] 99 % (02/27 0700) FiO2 (%):  [30 %] 30 % (02/27 0700) Weight:  [153 lb 3.5 oz (69.5 kg)] 153 lb 3.5 oz (69.5 kg) (02/27 0407)  General - Well nourished, well developed, intubated, not on sedation.  Ophthalmologic - Fundi not visualized.  Cardiovascular - RRR, no m/g/r  Neuro - intubated off sedation, does not open eyes on voice or pain. Pupils bilateral 2mm, briskly reactive to light. Sluggish doll's eyes, no corneal. Positive gag and cough. On pain stimulation, LUE withdraws not against gravity, BLE extension and RUE mild withdraw not against gravity. Babinski negative bilaterally. Sensation, coordination or gait not tested.    ASSESSMENT/PLAN Chelsea Schaefer is a 66 y.o. female with history of hypertension and arthritis presenting with unresponsiveness. She did not receive IV t-PA due to hemorrhagic stroke.  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 Exam  better today.   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 No AVM. 3mm left PCOM aneurysm  2D Echo - EF 65-70%. No cardiac source of emboli identified.  LDL - 241  HgbA1c - 5.9  VTE prophylaxis - heparin subq  Diet NPO time specified  No antithrombotic prior to admission, now on No antithrombotic  Ongoing aggressive stroke risk factor management  Therapy recommendations: pending  Disposition: Pending  Fever and leukocystosis  Intermittent low grade fever   On cooling blanket  WBC 17.3->20.5 -> 27-> 22.7--> 18.6  UA negative  CXR - atelectasis  B Cx - pending  Sputum Cx -  negative, normal resp flora  Hold off antibiotics as fever gradually improving   Respiratory failure  Due to pontine ICH  On vent  CCM on board  Not breathing over the vent  May need to consider trach earlier. Discussed with son today.  Hypertension  Stable - IV Cleviprex  Continue Losartan to 50mg  bid, off cleviprex as able  Continue amlodipine 10mg , Coreg 6.25 mg BID  BP goal < 140  Long-term BP goal normotensive  Hyperlipidemia  Home meds:  No lipid lowering medications PTA  LDL 241, goal < 70  Continue lipitor 40mg  daily.  Continue statin at discharge  Other Stroke Risk Factors  Advanced age  Other Active Problems  Intubated  3 mm left P com aneurysm.  Hospital day # 4   Attending note to follow   Rich Numberarly Jian Hodgman, MD, MPH Internal Medicine Resident, PGY-III Pager: [phone]  09/20/2016 7:48 AM    To contact Stroke Continuity provider, please refer to [email or web address]. After hours, contact General Neurology

## 2016-09-20 NOTE — Progress Notes (Signed)
PT Cancellation Note  Patient Details Name: Donella Stadeu Thi Sutter MRN: [id number] DOB: July 29, 1950   Cancelled Treatment:    Reason Eval/Treat Not Completed: Patient not medically ready Pt remains of bedrest. Please update activity orders when appropriate for therapy. Thanks   Miyonna Ormiston A Rosselyn Martha 09/20/2016, 8:38 AM Mylo RedShauna Letoya Stallone, PT, DPT [phone]

## 2016-09-21 DIAGNOSIS — I613 Nontraumatic intracerebral hemorrhage in brain stem: Secondary | ICD-10-CM | POA: Diagnosis not present

## 2016-09-21 DIAGNOSIS — R509 Fever, unspecified: Secondary | ICD-10-CM | POA: Diagnosis not present

## 2016-09-21 DIAGNOSIS — D72829 Elevated white blood cell count, unspecified: Secondary | ICD-10-CM | POA: Diagnosis not present

## 2016-09-21 DIAGNOSIS — J9601 Acute respiratory failure with hypoxia: Secondary | ICD-10-CM | POA: Diagnosis not present

## 2016-09-21 LAB — CBC WITH DIFFERENTIAL/PLATELET
BASOS PCT: 0 %
Basophils Absolute: 0 10*3/uL (ref 0.0–0.1)
Eosinophils Absolute: 0.2 10*3/uL (ref 0.0–0.7)
Eosinophils Relative: 2 %
HEMATOCRIT: 37.6 % (ref 36.0–46.0)
HEMOGLOBIN: 12.6 g/dL (ref 12.0–15.0)
Lymphocytes Relative: 17 %
Lymphs Abs: 2.1 10*3/uL (ref 0.7–4.0)
MCH: 25.5 pg — ABNORMAL LOW (ref 26.0–34.0)
MCHC: 33.5 g/dL (ref 30.0–36.0)
MCV: 76 fL — ABNORMAL LOW (ref 78.0–100.0)
MONOS PCT: 9 %
Monocytes Absolute: 1.1 10*3/uL — ABNORMAL HIGH (ref 0.1–1.0)
NEUTROS ABS: 9.2 10*3/uL — AB (ref 1.7–7.7)
NEUTROS PCT: 73 %
Platelets: 309 10*3/uL (ref 150–400)
RBC: 4.95 MIL/uL (ref 3.87–5.11)
RDW: 15.5 % (ref 11.5–15.5)
WBC: 12.6 10*3/uL — ABNORMAL HIGH (ref 4.0–10.5)

## 2016-09-21 LAB — RENAL FUNCTION PANEL
ALBUMIN: 2.5 g/dL — AB (ref 3.5–5.0)
Anion gap: 10 (ref 5–15)
BUN: 12 mg/dL (ref 6–20)
CO2: 24 mmol/L (ref 22–32)
CREATININE: 0.45 mg/dL (ref 0.44–1.00)
Calcium: 8.7 mg/dL — ABNORMAL LOW (ref 8.9–10.3)
Chloride: 107 mmol/L (ref 101–111)
GFR calc Af Amer: >60 mL/min (ref >60)
GLUCOSE: 145 mg/dL — AB (ref 65–99)
POTASSIUM: 4.7 mmol/L (ref 3.5–5.1)
Phosphorus: 2.1 mg/dL — ABNORMAL LOW (ref 2.5–4.6)
Sodium: 141 mmol/L (ref 135–145)

## 2016-09-21 LAB — GLUCOSE, CAPILLARY
GLUCOSE-CAPILLARY: 107 mg/dL — AB (ref 65–99)
GLUCOSE-CAPILLARY: 135 mg/dL — AB (ref 65–99)
Glucose-Capillary: 136 mg/dL — ABNORMAL HIGH (ref 65–99)
Glucose-Capillary: 147 mg/dL — ABNORMAL HIGH (ref 65–99)
Glucose-Capillary: 148 mg/dL — ABNORMAL HIGH (ref 65–99)

## 2016-09-21 MED ORDER — SODIUM CHLORIDE 0.9 % IV BOLUS (SEPSIS)
1000.0000 mL | Freq: Once | INTRAVENOUS | Status: AC
Start: 2016-09-21 — End: 2016-09-21
  Administered 2016-09-21: 1000 mL via INTRAVENOUS

## 2016-09-21 MED ORDER — BISACODYL 10 MG RE SUPP
10.0000 mg | Freq: Once | RECTAL | Status: DC
  Filled 2016-09-21: qty 1

## 2016-09-21 MED ORDER — SODIUM CHLORIDE 0.9 % IV SOLN
0.0000 ug/min | INTRAVENOUS | Status: DC
  Administered 2016-09-21: 20 ug/min via INTRAVENOUS
  Administered 2016-09-22: 35 ug/min via INTRAVENOUS
  Filled 2016-09-21 (×3): qty 1

## 2016-09-21 MED ORDER — SENNOSIDES 8.8 MG/5ML PO SYRP
5.0000 mL | ORAL_SOLUTION | Freq: Two times a day (BID) | ORAL | Status: DC
  Administered 2016-09-24 – 2016-09-25 (×2): 5 mL
  Filled 2016-09-21 (×4): qty 5

## 2016-09-21 NOTE — Progress Notes (Signed)
STROKE TEAM PROGRESS NOTE   SUBJECTIVE (INTERVAL HISTORY) Son and daughter at bedside. Patient with high fever of 103.4 this morning. On cooling blanket. Pt slightly less responsive as yesterday. Discussed trach with son and he is agreeable to doing this procedure earlier.    OBJECTIVE Temp:  [99.2 F (37.3 C)-103.4 F (39.7 C)] 100.1 F (37.8 C) (02/28 1200) Pulse Rate:  [75-101] 75 (02/28 1300) Cardiac Rhythm: Normal sinus rhythm (02/28 1200) Resp:  [15-27] 18 (02/28 1300) BP: (77-164)/(51-114) 92/51 (02/28 1300) SpO2:  [96 %-100 %] 100 % (02/28 1300) FiO2 (%):  [30 %] 30 % (02/28 1109) Weight:  [152 lb 8.9 oz (69.2 kg)] 152 lb 8.9 oz (69.2 kg) (02/28 0500)  CBC:   Recent Labs Lab 09/20/16 0421 09/21/16 0303  WBC 18.6* 12.6*  NEUTROABS 14.4* 9.2*  HGB 13.4 12.6  HCT 40.5 37.6  MCV 76.3* 76.0*  PLT 339 309    Basic Metabolic Panel:   Recent Labs Lab 09/20/16 0421 09/20/16 1712 09/21/16 0303  NA 139  --  141  K 3.3*  --  4.7  CL 106  --  107  CO2 24  --  24  GLUCOSE 172*  --  145*  BUN 9  --  12  CREATININE 0.43*  --  0.45  CALCIUM 8.7*  --  8.7*  MG 2.1 2.1  --   PHOS 1.3*  --  2.1*    Lipid Panel:     Component Value Date/Time   CHOL 323 (H) 09/13/2016 0806   TRIG 265 (H) 09/19/2016 0258   HDL 37 (L) 08/26/2016 0806   CHOLHDL 8.7 09/18/2016 0806   VLDL 45 (H) 09/21/2016 0806   LDLCALC 241 (H) 08/31/2016 0806   HgbA1c:  Lab Results  Component Value Date   HGBA1C 5.9 (H) 09/17/2016   Urine Drug Screen:     Component Value Date/Time   LABOPIA NONE DETECTED 09/13/2016 1930   COCAINSCRNUR NONE DETECTED 08/29/2016 1930   LABBENZ NONE DETECTED 09/10/2016 1930   AMPHETMU NONE DETECTED 09/20/2016 1930   THCU NONE DETECTED 09/11/2016 1930   LABBARB NONE DETECTED 09/05/2016 1930      IMAGING I have personally reviewed the radiological images below and agree with the radiology interpretations.  Ct Head Wo Contrast 08/27/2016 1. Central pontine  hematoma is essentially size stable from earlier today. Contiguous clot dissecting in the central midbrain and along the fourth ventricular floor is mildly increased. Patent fourth ventricle and no hydrocephalus.  2. Chronic microvascular disease.   Dg Chest Portable 1 View 09/18/2016 1. Endotracheal tube tip at the right mainstem bronchus.  2. Left lower lobe atelectasis could be from #1 or aspiration.   Ct Head Code Stroke W/o Cm 09/05/2016 1. 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 Early extension into the left cerebellar peduncle and area postrema. No intraventricular or subarachnoid extension. Patent basilar cisterns. No significant intracranial mass effect at this time.  2. Underlying chronic small vessel disease.  3. ASPECTS is not applicable; acute hemorrhage.   TTE  09/17/2016 Study Conclusions - Left ventricle: The cavity size was normal. Wall thickness was   increased in a pattern of mild LVH. Systolic function was   vigorous. The estimated ejection fraction was in the range of 65% to 70%.    Wall motion was normal; there were no regional wall   motion abnormalities. Doppler parameters are consistent with   abnormal left ventricular relaxation (grade 1 diastolic  dysfunction). - Aortic valve: Valve area (VTI): 1.77 cm^2. Valve area (Vmax):   1.37 cm^2. Valve area (Vmean): 1.79 cm^2. - Atrial septum: No defect or patent foramen ovale was identified. - Technically adequate study.  CTA Head and Neck 09/18/2016  CTA NECK  1. Negative CTA of the head and neck. No flow limiting or critical stenosis identified. 2. Emphysema.  CTA HEAD  1. Stable size of acute pontine hemorrhage with slightly increased localized vasogenic edema.  There is mildly increased mass effect on the fourth ventricle posteriorly with slightly increased ventricular dilatation. No frank hydrocephalus. Continued close interval monitoring is warranted. 2. No aneurysm or other  vascular abnormality identified underlying the acute pontine hemorrhage. 3. 3 mm left P com aneurysm. 4. Mild atheromatous plaque involving the cavernous ICAs and right V4 segment without high-grade stenosis.  Ct Head Wo Contrast 09/19/2016 IMPRESSION: Size stable brainstem hematoma.  Stable normal ventricular volume.   Dg Chest Port 1 View 09/19/2016 IMPRESSION: 1. Lines and tubes in stable position. 2. Persistent bibasilar subsegmental atelectasis and small left pleural effusion. No significant change.     PHYSICAL EXAM  Temp:  [99.2 F (37.3 C)-103.4 F (39.7 C)] 100.1 F (37.8 C) (02/28 1200) Pulse Rate:  [75-101] 75 (02/28 1300) Resp:  [15-27] 18 (02/28 1300) BP: (77-164)/(51-114) 92/51 (02/28 1300) SpO2:  [96 %-100 %] 100 % (02/28 1300) FiO2 (%):  [30 %] 30 % (02/28 1109) Weight:  [152 lb 8.9 oz (69.2 kg)] 152 lb 8.9 oz (69.2 kg) (02/28 0500)  General - Well nourished, well developed, intubated, not on sedation.  Ophthalmologic - Fundi not visualized.  Cardiovascular - RRR, no m/g/r  Neuro - intubated off sedation, does not open eyes on voice or pain. Pupils bilateral 2mm, briskly reactive to light. Sluggish doll's eyes, no corneal. Positive gag and cough. On pain stimulation, LUE withdraws not against gravity, LLE with DF, RLE mild extension and RUE no movement. Babinski negative bilaterally. Sensation, coordination or gait not tested.    ASSESSMENT/PLAN [name] is a 66 y.o. female with history of hypertension and arthritis presenting with unresponsiveness. She did not receive IV t-PA due to hemorrhagic stroke.  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 Exam better today.   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 No AVM. 3mm left PCOM aneurysm  2D Echo - EF 65-70%. No  cardiac source of emboli identified.  LDL - 241  HgbA1c - 5.9  VTE prophylaxis - SCDs Diet NPO time specified  No antithrombotic prior to admission, now on No antithrombotic  Ongoing aggressive stroke risk factor management  Therapy recommendations: pending  Disposition: Pending  Fever and leukocystosis  Fever continues   On cooling blanket  WBC 17.3->20.5 -> 27-> 22.7--> 18.6-->12.6  UA negative  CXR - atelectasis  B Cx - NGTD  Sputum Cx - negative, normal resp flora  Respiratory failure  Due to pontine ICH  On vent  CCM on board  Not breathing over the vent  May need to consider trach earlier. Discussed with son today and he is agreeable.   Hypertension  Stable - IV Cleviprex  Continue Losartan to 50mg  bid  Continue amlodipine 10mg , Coreg 6.25 mg BID  BP goal < 160  Long-term BP goal normotensive  Hyperlipidemia  Home meds:  No lipid lowering medications  PTA  LDL 241, goal < 70  Continue lipitor 40mg  daily.  Continue statin at discharge  Other Stroke Risk Factors  Advanced age  Other Active Problems  3 mm left P com aneurysm.  Hospital day # 5   Attending note to follow   Rich Number, MD, MPH Internal Medicine Resident, PGY-III Pager: [phone]  09/21/2016 1:53 PM  I reviewed above note and agree with the assessment and plan. Pt has continued fever, culture all negative. Treatment per CCM. Flex BP goal to < 160 and off cleviprex. Discussed with son and family agree for early trach. I also spend a fair amount of time answering family questions about potential prognosis, treatment options and answered all their questions. This patient is critically ill due to brainstem ICH, fever, leukocytosis, HLD and at significant risk of neurological worsening, death form recurrent ICH, brain herniation, hydrocephalus. This patient's care requires constant monitoring of vital signs, hemodynamics, respiratory and cardiac monitoring, review of  multiple databases, neurological assessment, discussion with family, other specialists and medical decision making of high complexity. I spent 50 minutes of neurocritical care time in the care of this patient.   Marvel Plan, MD PhD Stroke Neurology 09/21/2016 6:45 PM    To contact Stroke Continuity provider, please refer to [email or web address]. After hours, contact General Neurology

## 2016-09-21 NOTE — Progress Notes (Signed)
[facility] Rounding Note  Presenting HPI:  66 y.o.  female w/ no sig history. Was in usual health until am of admit. Pt awoke around 0700 went to RR. About 0730 she was found unresponsive on the floor. On arrival to ER she would withdrawal to pain. Was hypertensive w/ SBP in 170-180s. CT head + for pontine ICH. PCCM asked to admit.  Subjective:  No acute events overnight. CT yesterday stable  Objective:  Vent Mode: PRVC FiO2 (%):  [30 %] 30 % Set Rate:  [15 bmp] 15 bmp Vt Set:  [440 mL] 440 mL PEEP:  [5 cmH20] 5 cmH20 Plateau Pressure:  [18 cmH20-22 cmH20] 22 cmH20  Temp:  [99.2 F (37.3 C)-103.1 F (39.5 C)] 103.1 F (39.5 C) (02/28 0800) Pulse Rate:  [78-101] 99 (02/28 0828) Resp:  [14-24] 24 (02/28 0724) BP: (109-164)/(45-114) 131/60 (02/28 0828) SpO2:  [95 %-100 %] 99 % (02/28 0724) FiO2 (%):  [30 %] 30 % (02/28 0800) Weight:  [69.2 kg (152 lb 8.9 oz)] 69.2 kg (152 lb 8.9 oz) (02/28 0500)  Physical Exam: General:  Female of normal body habitus appears comfortable in bed Neuro:  Unresponsive on vent to pain. PERRL, +gag.  HEENT:  [place]/AT, No JVD noted, PERRL Cardiovascular:  RRR, no MRG Lungs:  Clear bilateral breath sounds Abdomen:  Soft, non-distended Musculoskeletal:  No acute deformity Skin:  Intact, MMM  LINES/TUBES: OETT 7.5 2/23 >>> Foley 2/25 >>> OGT 2/23 >>> PIV  CBC Latest Ref Rng & Units 09/21/2016 09/20/2016 09/19/2016  WBC 4.0 - 10.5 K/uL 12.6(H) 18.6(H) 22.9(H)  Hemoglobin 12.0 - 15.0 g/dL 12.6 13.4 13.1  Hematocrit 36.0 - 46.0 % 37.6 40.5 39.9  Platelets 150 - 400 K/uL 309 339 261   BMP Latest Ref Rng & Units 09/21/2016 09/20/2016 09/19/2016  Glucose 65 - 99 mg/dL 145(H) 172(H) 119(H)  BUN 6 - 20 mg/dL _0 Creatinine 0.44 - 1.00 mg/dL 0.45 0.43(L) 0.48  Sodium 135 - 145 mmol/L 141 139 141  Potassium 3.5 - 5.1 mmol/L 4.7 3.3(L) 3.1(L)  Chloride 101 - 111 mmol/L 107 106 110  CO2 22 - 32 mmol/L 24 24 21(L)  Calcium 8.9 - 10.3  mg/dL 8.7(L) 8.7(L) 8.4(L)   Hepatic Function Latest Ref Rng & Units 09/21/2016 09/20/2016 08/26/2016  Total Protein 6.5 - 8.1 g/dL - - 7.7  Albumin 3.5 - 5.0 g/dL 2.5(L) 2.8(L) 3.6  AST 15 - 41 U/L - - 26  ALT 14 - 54 U/L - - 23  Alk Phosphatase 38 - 126 U/L - - 62  Total Bilirubin 0.3 - 1.2 mg/dL - - 0.7    IMAGING/STUDIES: CT HEAD W/O 2/23:  Central pontine hematoma is essentially size stable from earlier today. Contiguous clot dissecting in the central midbrain and along the fourth ventricular floor is mildly increased. Patent fourth ventricle and no hydrocephalus. Chronic microvascular disease. TTE 2/24: Mild LVH with normal cavity size, EF 65-70%, and grade 1 diastolic dysfunction. No aortic stenosis or regurgitation. Aortic root normal in size. No mitral stenosis or regurgitation. No pulmonic regurgitation or stenosis. Trivial tricuspid regurgitation without stenosis. No pericardial effusion. CTA HEAD/NECK 2/25:  Negative CTA of the head and neck. No flow limiting or critical stenosis identified. Emphysema. Stable size of acute pontine hemorrhage with slightly increased localized vasogenic edema. There is mildly increased mass effect on the fourth ventricle posteriorly with slightly increased ventricular dilatation. No frank hydrocephalus. 3 mm left P com aneurysm. CT HEAD W/O  2/26:  Size stable brainstem hematoma.  Stable normal ventricular volume. PORT CXR 2/26:  Personally reviewed by me. Low lung volumes. No focal opacity appreciated. Endotracheal and enteric feeding tubes in good position.  MICROBIOLOGY: MRSA PCR 2/23:  Negative  Blood Cultures x2 2/24 >>> Tracheal Aspirate Culture 2/24 >>>  ANTIBIOTICS: None.  SIGNIFICANT EVENTS: 02/23 - Admit  ASSESSMENT/PLAN:  66 y.o. female with acute central pontine hemorrhage and hypertensive emergency. Patient continues to exhibit no spontaneous respirations and exam remains poor.   Acute, nontraumatic central pontine intracerebral  hemorrhage: Neurosurgery - no indication for intraventricular drain placement. - Management per neurology, prognosis poor. - Further goals of care discussions with family today 02/28.  Hypertensive emergency. - Continue cleviprex. Remains max dose. - BP goal per neurology (<140mHg). - Continue amlodipine and losartan per tube. - Will add coreg 6.210mBID in attempt to wean drip  Acute hypoxic respiratory failure: apneic on SBT. - Will require tracheostomy if family wishes to be aggressive (family to have discussion with Dr. NeAshok Cordiahis AM 02/28).  SIRS: Multifactorial. Procalcitonin remains < 0.25. -No significant growth to date on cultures, continue to follow -Continue to hold empiric abx.  Hyperglycemia:  Hgb A1C 5.9 on 2/24.  - Continue Accuchecks q 4 hours and SSI.  Prophylaxis: Protonix IV.  D/c subq heparin given ICH. Diet: Tube feeds per dietary Code Status: Full code per previous physician discussion. Disposition: Critically ill in ICU Family Update: Son updated at bedside, entire family is here today to discuss goals of care further with Dr. NeAshok Cordia  [facility]eon[facility]monary [facility] Care Medicine Pager: [phone] ([phone]/28/2018, 9:00 AM

## 2016-09-21 NOTE — Progress Notes (Signed)
OT Cancellation Note  Patient Details Name: Chelsea Schaefer MRN: [id number] DOB: 04-23-1951   Cancelled Treatment:    Reason Eval/Treat Not Completed: Patient not medically ready OT order received and appreciated however this conflicts with current bedrest order set. Please increase activity tolerance as appropriate and remove bedrest from orders. . OT will hold evaluation at this time and will check back as time allows pending increased activity orders.   Boone MasterJones, Zauria Dombek B 09/21/2016, 6:50 AM

## 2016-09-21 NOTE — Plan of Care (Signed)
  Interdisciplinary Goals of Care Family Meeting   Date carried out:: 09/21/2016  Location of the meeting: Bedside  Member's involved: Physician, Bedside Registered Nurse, Family Member or next of kin and Other: Son, Daughter, & Interpreter.   Durable Power of Insurance risk surveyorAttorney or acting medical decision maker: Husband    Discussion: We discussed goals of care for Chelsea Schaefer .  Lengthy discussion with family at bedside utilizing interpreter. I answered their multiple questions and expressed my concerns regarding her ability to progress and recovery from this devastating brainstem hemorrhage. We discussed potential treatment options including tracheostomy and PEG tube placement as well as simply proceed with full comfort care. Family expressed deeply religious beliefs and wished to do everything possible until there is 0% chance that she could recover. We discussed the low probability of success for meaningful recovery with each passing day and failure to show/demonstrate signs of improvement. We discussed the fact that she may ultimately be dependent upon others for her care with a tracheostomy and feeding tube for the rest of her natural life as well as even possibly long-term ventilator requirement. The family consider this a meaningful quality of life and still consider her as part of the family. We did discuss the disposition likely to a skilled nursing facility that would be able to manage a ventilator which could be far away and even in another state. We will proceed with scheduling her tracheostomy as soon as possible and I am consulting case management to look into potential disposition options. Bedside nurse is contacting the neurology service to provide the patient's family with certain statistics regarding recovery that they requested of me which I am unable to provide.  Code status: Full Code  Disposition: Continue current acute care  Time spent for the meeting: 36 minutes   Chelsea Schaefer  Chelsea Schaefer 09/21/2016, 2:53 PM

## 2016-09-21 NOTE — Progress Notes (Signed)
eLink Physician-Brief Progress Note Patient Name: Chelsea Schaefer DOB: [birth year] MRN: [id number]   Date of Service  09/21/2016  HPI/Events of Note  Low BP MAP's 50's  eICU Interventions  Will give 1 L NS and start NEO patient does not have CVL     Intervention Category Major Interventions: Hypotension - evaluation and management  Erin FullingKurian Drelyn Pistilli 09/21/2016, 7:43 PM

## 2016-09-21 NOTE — Progress Notes (Signed)
PT Cancellation and Discharge Note  Patient Details Name: Chelsea Schaefer MRN: [id number] DOB: 1950/08/03   Cancelled Treatment:    Reason Eval/Treat Not Completed: Patient not medically ready.  Noted pt remains on bedrest and no responses per Neurology note.  Spoke with RN about sign off of PT at this time and if pt becomes appropriate for PT and mobility, we will need new orders.     Alison MurrayMegan F Shakemia [place], [place] CarolinaPT [phone] 09/21/2016, 8:23 AM

## 2016-09-21 NOTE — Progress Notes (Signed)
RT transported pt from 3M09 to 4N22 on ventilator. Pt stable throughout with no complications. VS within normal limits. RT will continue to monitor.

## 2016-09-22 ENCOUNTER — Inpatient Hospital Stay (HOSPITAL_COMMUNITY): Payer: Medicare Other

## 2016-09-22 DIAGNOSIS — R6510 Systemic inflammatory response syndrome (SIRS) of non-infectious origin without acute organ dysfunction: Secondary | ICD-10-CM | POA: Diagnosis not present

## 2016-09-22 DIAGNOSIS — A419 Sepsis, unspecified organism: Secondary | ICD-10-CM

## 2016-09-22 DIAGNOSIS — D72829 Elevated white blood cell count, unspecified: Secondary | ICD-10-CM | POA: Diagnosis not present

## 2016-09-22 DIAGNOSIS — R509 Fever, unspecified: Secondary | ICD-10-CM | POA: Diagnosis not present

## 2016-09-22 DIAGNOSIS — Z7189 Other specified counseling: Secondary | ICD-10-CM | POA: Diagnosis not present

## 2016-09-22 DIAGNOSIS — J9601 Acute respiratory failure with hypoxia: Secondary | ICD-10-CM | POA: Diagnosis not present

## 2016-09-22 DIAGNOSIS — I613 Nontraumatic intracerebral hemorrhage in brain stem: Secondary | ICD-10-CM | POA: Diagnosis not present

## 2016-09-22 LAB — RENAL FUNCTION PANEL
Albumin: 2 g/dL — ABNORMAL LOW (ref 3.5–5.0)
Anion gap: 6 (ref 5–15)
BUN: 21 mg/dL — AB (ref 6–20)
CALCIUM: 8.1 mg/dL — AB (ref 8.9–10.3)
CO2: 26 mmol/L (ref 22–32)
CREATININE: 0.8 mg/dL (ref 0.44–1.00)
Chloride: 109 mmol/L (ref 101–111)
Glucose, Bld: 159 mg/dL — ABNORMAL HIGH (ref 65–99)
Phosphorus: 1.5 mg/dL — ABNORMAL LOW (ref 2.5–4.6)
Potassium: 2.8 mmol/L — ABNORMAL LOW (ref 3.5–5.1)
SODIUM: 141 mmol/L (ref 135–145)

## 2016-09-22 LAB — CBC WITH DIFFERENTIAL/PLATELET
BASOS PCT: 0 %
Basophils Absolute: 0 10*3/uL (ref 0.0–0.1)
EOS ABS: 0 10*3/uL (ref 0.0–0.7)
Eosinophils Relative: 0 %
HEMATOCRIT: 30.3 % — AB (ref 36.0–46.0)
Hemoglobin: 9.8 g/dL — ABNORMAL LOW (ref 12.0–15.0)
LYMPHS ABS: 2.1 10*3/uL (ref 0.7–4.0)
Lymphocytes Relative: 6 %
MCH: 24.4 pg — ABNORMAL LOW (ref 26.0–34.0)
MCHC: 32.3 g/dL (ref 30.0–36.0)
MCV: 75.4 fL — AB (ref 78.0–100.0)
MONO ABS: 1.7 10*3/uL — AB (ref 0.1–1.0)
Monocytes Relative: 5 %
NEUTROS PCT: 89 %
Neutro Abs: 30.4 10*3/uL — ABNORMAL HIGH (ref 1.7–7.7)
PLATELETS: 236 10*3/uL (ref 150–400)
RBC: 4.02 MIL/uL (ref 3.87–5.11)
RDW: 15.4 % (ref 11.5–15.5)
WBC: 34.2 10*3/uL — AB (ref 4.0–10.5)

## 2016-09-22 LAB — GLUCOSE, CAPILLARY
Glucose-Capillary: 155 mg/dL — ABNORMAL HIGH (ref 65–99)
Glucose-Capillary: 145 mg/dL — ABNORMAL HIGH (ref 65–99)
Glucose-Capillary: 143 mg/dL — ABNORMAL HIGH (ref 65–99)
Glucose-Capillary: 133 mg/dL — ABNORMAL HIGH (ref 65–99)

## 2016-09-22 LAB — MAGNESIUM: Magnesium: 1.9 mg/dL (ref 1.7–2.4)

## 2016-09-22 LAB — TRIGLYCERIDES: TRIGLYCERIDES: 240 mg/dL — AB (ref <150)

## 2016-09-22 LAB — CULTURE, BLOOD (ROUTINE X 2)
CULTURE: NO GROWTH
CULTURE: NO GROWTH

## 2016-09-22 LAB — PHOSPHORUS
PHOSPHORUS: 2.7 mg/dL (ref 2.5–4.6)
Phosphorus: 1.6 mg/dL — ABNORMAL LOW (ref 2.5–4.6)

## 2016-09-22 LAB — POTASSIUM: POTASSIUM: 3.8 mmol/L (ref 3.5–5.1)

## 2016-09-22 MED ORDER — DEXTROSE 5 % IV SOLN
30.0000 mmol | Freq: Once | INTRAVENOUS | Status: AC
  Administered 2016-09-22: 30 mmol via INTRAVENOUS
  Filled 2016-09-22: qty 10

## 2016-09-22 NOTE — Progress Notes (Signed)
[facility] Rounding Note  Presenting HPI:  66 y.o.  female w/ no sig history. Was in usual health until am of admit. Pt awoke around 0700 went to RR. About 0730 she was found unresponsive on the floor. On arrival to ER she would withdrawal to pain. Was hypertensive w/ SBP in 170-180s. CT head + for pontine ICH. PCCM asked to admit.  Subjective:  No events overnight, remains completely unresponsive  Objective:  Vent Mode: PRVC FiO2 (%):  [30 %-100 %] 100 % Set Rate:  [15 bmp] 15 bmp Vt Set:  [440 mL] 440 mL PEEP:  [5 cmH20] 5 cmH20 Pressure Support:  [5 cmH20] 5 cmH20 Plateau Pressure:  [15 cmH20-30 cmH20] 22 cmH20  Temp:  [99.1 F (37.3 C)-103.1 F (39.5 C)] 101 F (38.3 C) (03/01 0800) Pulse Rate:  [62-96] 84 (03/01 1000) Resp:  [12-26] 17 (03/01 1000) BP: (75-161)/(37-127) 157/51 (03/01 1000) SpO2:  [92 %-100 %] 99 % (03/01 1000) FiO2 (%):  [30 %-100 %] 100 % (03/01 0849) Weight:  [72.7 kg (160 lb 4.4 oz)] 72.7 kg (160 lb 4.4 oz) (03/01 0431)  Physical Exam: General:  Female of normal body habitus, comatose  Neuro:  Unresponsive on vent. PERRL, +gag.  HEENT:  [place]/AT, No JVD noted, PERRL Cardiovascular:  RRR, no MRG Lungs:  Weaning, CTA bilaterally Abdomen:  Soft, non-distended Musculoskeletal:  No acute deformity Skin:  Intact, MMM  LINES/TUBES: OETT 7.5 2/23 >>> Foley 2/25 >>> OGT 2/23 >>> PIV  CBC Latest Ref Rng & Units 09/22/2016 09/21/2016 09/20/2016  WBC 4.0 - 10.5 K/uL 34.2(H) 12.6(H) 18.6(H)  Hemoglobin 12.0 - 15.0 g/dL 9.8(L) 12.6 13.4  Hematocrit 36.0 - 46.0 % 30.3(L) 37.6 40.5  Platelets 150 - 400 K/uL 236 309 339   BMP Latest Ref Rng & Units 09/22/2016 09/21/2016 09/20/2016  Glucose 65 - 99 mg/dL 159(H) 145(H) 172(H)  BUN 6 - 20 mg/dL 21(H) 12 9  Creatinine 0.44 - 1.00 mg/dL 0.80 0.45 0.43(L)  Sodium 135 - 145 mmol/L 141 141 139  Potassium 3.5 - 5.1 mmol/L 2.8(L) 4.7 3.3(L)  Chloride 101 - 111 mmol/L 109 107 106  CO2 22 - 32 mmol/L 26  24 24   Calcium 8.9 - 10.3 mg/dL 8.1(L) 8.7(L) 8.7(L)   Hepatic Function Latest Ref Rng & Units 09/22/2016 09/21/2016 09/20/2016  Total Protein 6.5 - 8.1 g/dL - - -  Albumin 3.5 - 5.0 g/dL 2.0(L) 2.5(L) 2.8(L)  AST 15 - 41 U/L - - -  ALT 14 - 54 U/L - - -  Alk Phosphatase 38 - 126 U/L - - -  Total Bilirubin 0.3 - 1.2 mg/dL - - -    IMAGING/STUDIES: CT HEAD W/O 2/23:  Central pontine hematoma is essentially size stable from earlier today. Contiguous clot dissecting in the central midbrain and along the fourth ventricular floor is mildly increased. Patent fourth ventricle and no hydrocephalus. Chronic microvascular disease.  TTE 2/24: Mild LVH with normal cavity size, EF 65-70%, and grade 1 diastolic dysfunction. No aortic stenosis or regurgitation. Aortic root normal in size. No mitral stenosis or regurgitation. No pulmonic regurgitation or stenosis. Trivial tricuspid regurgitation without stenosis. No pericardial effusion.  CTA HEAD/NECK 2/25:  Negative CTA of the head and neck. No flow limiting or critical stenosis identified. Emphysema. Stable size of acute pontine hemorrhage with slightly increased localized vasogenic edema. There is mildly increased mass effect on the fourth ventricle posteriorly with slightly increased ventricular dilatation. No frank hydrocephalus. 3 mm left P  com aneurysm.  CT HEAD W/O 2/26:  Size stable brainstem hematoma.  Stable normal ventricular volume.  PORT CXR 2/26:  Personally reviewed by me. Low lung volumes. No focal opacity appreciated. Endotracheal and enteric feeding tubes in good position.  MICROBIOLOGY: MRSA PCR 2/23:  Negative  Blood Cultures x2 2/24 >>> Tracheal Aspirate Culture 2/24 >>>  ANTIBIOTICS: None.  SIGNIFICANT EVENTS: 02/23 - Admit  ASSESSMENT/PLAN:  66 y.o. female with acute central pontine hemorrhage and hypertensive emergency. Patient continues to exhibit no spontaneous respirations and exam remains poor.   Acute, nontraumatic  central pontine intracerebral hemorrhage: Neurosurgery - no indication for intraventricular drain placement. - Management per neurology, prognosis poor. - Husband coming in today for discussion prior to trach, if insist on trach then will do in AM  Hypertensive emergency. - Continue cleviprex. Remains max dose. - BP goal per neurology (<157mHg). - Continue amlodipine and losartan per tube. - Will add coreg 6.282mBID in attempt to wean drip  Acute hypoxic respiratory failure: apneic on SBT. - Will require tracheostomy if family wishes to be aggressive (family to have discussion with Dr. NeAshok Cordiahis AM 02/28). - PS as tolerated for now  SIRS: Multifactorial. Procalcitonin remains < 0.25. -No significant growth to date on cultures, continue to follow -Continue to hold empiric abx.  Hyperglycemia:  Hgb A1C 5.9 on 2/24.  - Continue Accuchecks q 4 hours and SSI.  Prophylaxis: Protonix IV.  D/c subq heparin given ICH. Diet: Tube feeds per dietary Code Status: Full code per previous physician discussion. Disposition: Critically ill in ICU Family Update: Spoke with son, he was not aware that prognosis is very poor and likely she will remain inthis condition for the foreseeable future.  He is to bring his father in with translator for further discussions.  The patient is critically ill with multiple organ systems failure and requires high complexity decision making for assessment and support, frequent evaluation and titration of therapies, application of advanced monitoring technologies and extensive interpretation of multiple databases.   Critical Care Time devoted to patient care services described in this note is  45  Minutes. This time reflects time of care of this signee Dr WeJennet MaduroThis critical care time does not reflect procedure time, or teaching time or supervisory time of PA/NP/Med student/Med Resident etc but could involve care discussion time.  WeRush FarmerM.D. [facility] CenterPulmonary/Critical Care Medicine. Pager: [phone] hours pager: [phone]

## 2016-09-22 NOTE — Progress Notes (Signed)
Patient appears to be exhibiting signs of decorticate posturing.  Dr. Roda ShuttersXu with Neurology notified and stat CT ordered.  [place], Mitzi HansenJessica Marie

## 2016-09-22 NOTE — Progress Notes (Addendum)
eLink Physician-Brief Progress Note Patient Name: Chelsea Schaefer DOB: 04-Nov-1950 MRN: [id number]   Date of Service  09/22/2016  HPI/Events of Note  Follow up on  the patient.   Blood pressure dropped with MAPs in the 50s earlier.  She was admitted as hypertensive with pontine lesion/bleed.   Neo-Synephrine drip started which increased her  MAPs in the 90s on low dose neo at 20 mcg/kg/min  RN turned off neo drip and we are back at where we started. Blood pressure 84/50, heart rate 85, respiratory rate 16, sats 99%.   eICU Interventions  Slowly restart neo drip Will d/c norvasc, cozaar, coreg.      Intervention Category Major Interventions: Hypotension - evaluation and management  Daneen SchickJose Angelo A De Dios 09/22/2016, 1:24 AM

## 2016-09-22 NOTE — Progress Notes (Signed)
eLink Physician-Brief Progress Note Patient Name: Chelsea Schaefer DOB: 02/24/1951 MRN: [id number]   Date of Service  09/22/2016  HPI/Events of Note  Low k and phos  eICU Interventions  repleted     Intervention Category Major Interventions: Electrolyte abnormality - evaluation and management  Daneen SchickJose Angelo A De Dios 09/22/2016, 4:53 AM

## 2016-09-22 NOTE — Progress Notes (Signed)
STROKE TEAM PROGRESS NOTE   SUBJECTIVE (INTERVAL HISTORY) Son at bedside. Pt had high grade fever today and yesterday, with low BP yesterday and put on neo briefly, concerning for sepsis. Pt had neuro decline today with posturing on pain stimulation. Again discussed with son today about poor prognosis, he would like to discuss with other family. Postpone Trach / PEG for now.     OBJECTIVE Temp:  [99.1 F (37.3 C)-103.1 F (39.5 C)] 102.9 F (39.4 C) (03/01 2030) Pulse Rate:  [69-93] 89 (03/01 2200) Cardiac Rhythm: Normal sinus rhythm (03/01 2030) Resp:  [12-25] 17 (03/01 2200) BP: (75-167)/(48-127) 143/61 (03/01 2200) SpO2:  [94 %-100 %] 98 % (03/01 2200) FiO2 (%):  [30 %-100 %] 30 % (03/01 2030) Weight:  [160 lb 4.4 oz (72.7 kg)] 160 lb 4.4 oz (72.7 kg) (03/01 0431)  CBC:   Recent Labs Lab 09/21/16 0303 09/22/16 0319  WBC 12.6* 34.2*  NEUTROABS 9.2* 30.4*  HGB 12.6 9.8*  HCT 37.6 30.3*  MCV 76.0* 75.4*  PLT 309 236    Basic Metabolic Panel:   Recent Labs Lab 09/20/16 1712 09/21/16 0303 09/22/16 0319 09/22/16 1017  NA  --  141 141  --   K  --  4.7 2.8* 3.8  CL  --  107 109  --   CO2  --  24 26  --   GLUCOSE  --  145* 159*  --   BUN  --  12 21*  --   CREATININE  --  0.45 0.80  --   CALCIUM  --  8.7* 8.1*  --   MG 2.1  --  1.9  --   PHOS  --  2.1* 1.6*  1.5* 2.7    Lipid Panel:     Component Value Date/Time   CHOL 323 (H) 09/13/2016 0806   TRIG 240 (H) 09/22/2016 1017   HDL 37 (L) 09/15/2016 0806   CHOLHDL 8.7 09/03/2016 0806   VLDL 45 (H) 09/12/2016 0806   LDLCALC 241 (H) 08/26/2016 0806   HgbA1c:  Lab Results  Component Value Date   HGBA1C 5.9 (H) 09/17/2016   Urine Drug Screen:     Component Value Date/Time   LABOPIA NONE DETECTED 08/31/2016 1930   COCAINSCRNUR NONE DETECTED 09/11/2016 1930   LABBENZ NONE DETECTED 09/19/2016 1930   AMPHETMU NONE DETECTED 09/15/2016 1930   THCU NONE DETECTED 09/17/2016 1930   LABBARB NONE DETECTED  09/17/2016 1930      IMAGING I have personally reviewed the radiological images below and agree with the radiology interpretations.  Ct Head Wo Contrast 08/27/2016 1. Central pontine hematoma is essentially size stable from earlier today. Contiguous clot dissecting in the central midbrain and along the fourth ventricular floor is mildly increased. Patent fourth ventricle and no hydrocephalus.  2. Chronic microvascular disease.   Dg Chest Portable 1 View 09/06/2016 1. Endotracheal tube tip at the right mainstem bronchus.  2. Left lower lobe atelectasis could be from #1 or aspiration.   Ct Head Code Stroke W/o Cm 09/10/2016 1. 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 Early extension into the left cerebellar peduncle and area postrema. No intraventricular or subarachnoid extension. Patent basilar cisterns. No significant intracranial mass effect at this time.  2. Underlying chronic small vessel disease.  3. ASPECTS is not applicable; acute hemorrhage.   TTE  09/17/2016 Study Conclusions - Left ventricle: The cavity size was normal. Wall thickness was   increased in a pattern of  mild LVH. Systolic function was   vigorous. The estimated ejection fraction was in the range of 65% to 70%.    Wall motion was normal; there were no regional wall   motion abnormalities. Doppler parameters are consistent with   abnormal left ventricular relaxation (grade 1 diastolic   dysfunction). - Aortic valve: Valve area (VTI): 1.77 cm^2. Valve area (Vmax):   1.37 cm^2. Valve area (Vmean): 1.79 cm^2. - Atrial septum: No defect or patent foramen ovale was identified. - Technically adequate study.  CTA Head and Neck 09/18/2016  CTA NECK  1. Negative CTA of the head and neck. No flow limiting or critical stenosis identified. 2. Emphysema.  CTA HEAD  1. Stable size of acute pontine hemorrhage with slightly increased localized vasogenic edema.  There is mildly increased mass  effect on the fourth ventricle posteriorly with slightly increased ventricular dilatation. No frank hydrocephalus. Continued close interval monitoring is warranted. 2. No aneurysm or other vascular abnormality identified underlying the acute pontine hemorrhage. 3. 3 mm left P com aneurysm. 4. Mild atheromatous plaque involving the cavernous ICAs and right V4 segment without high-grade stenosis.  Ct Head Wo Contrast 09/19/2016 IMPRESSION: Size stable brainstem hematoma.  Stable normal ventricular volume.   Dg Chest Port 1 View 09/19/2016 IMPRESSION: 1. Lines and tubes in stable position. 2. Persistent bibasilar subsegmental atelectasis and small left pleural effusion. No significant change.     Ct Head Wo Contrast 09/22/2016 IMPRESSION: 1. Large brainstem hematoma grossly stable compared to prior examination from 09/19/2016, as above. 2. Chronic microvascular ischemic changes in the cerebral white matter redemonstrated. 3. Cerebrovascular atherosclerosis.   Dg Chest Port 1 View 09/22/2016 IMPRESSION: Stable support apparatus. Small left pleural effusion with left basilar atelectasis. No infiltrate or pulmonary edema.     PHYSICAL EXAM  Temp:  [99.1 F (37.3 C)-103.1 F (39.5 C)] 102.9 F (39.4 C) (03/01 2030) Pulse Rate:  [69-93] 89 (03/01 2200) Resp:  [12-25] 17 (03/01 2200) BP: (75-167)/(48-127) 143/61 (03/01 2200) SpO2:  [94 %-100 %] 98 % (03/01 2200) FiO2 (%):  [30 %-100 %] 30 % (03/01 2030) Weight:  [160 lb 4.4 oz (72.7 kg)] 160 lb 4.4 oz (72.7 kg) (03/01 0431)  General - Well nourished, well developed, intubated, not on sedation.  Ophthalmologic - Fundi not visualized.  Cardiovascular - RRR, no m/g/r  Neuro - intubated off sedation, does not open eyes on voice or pain. Pupils bilateral 2mm, briskly reactive to light. Sluggish doll's eyes, no corneal. Positive gag and cough. On pain stimulation, LUE minimal withdraw, BLE and RUE extension. Babinski negative bilaterally.  Sensation, coordination or gait not tested.    ASSESSMENT/PLAN Chelsea Schaefer is a [age] y.o. female with history of hypertension and arthritis presenting with unresponsiveness. She did not receive IV t-PA due to hemorrhagic stroke.  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 Exam better today.   Resultant  coma  CT head - central pontine hemorrhage bilaterally  CT repeat x 3 - stable hematoma and no hydrocephalus  CTA H&N - Stable hematoma and no hydrocephalus. No AVM. 3mm left PCOM aneurysm  2D Echo - EF 65-70%. No cardiac source of emboli identified.  LDL - 241  HgbA1c - 5.9  VTE prophylaxis - SCDs Diet NPO time specified  No antithrombotic prior to admission, now on No antithrombotic  Ongoing aggressive stroke risk factor management  Therapy recommendations: pending  Disposition: Pending  Fever and leukocystosis - ? sepsis  High  grade fever continues   On cooling blanket  Hypotension 09/21/16 and required brief neo, now off  With neuro decline with posturing  WBC 17.3->20.5 -> 27-> 22.7--> 18.6-->12.6->34.2  UA negative  CXR - atelectasis  B Cx - negative  Sputum Cx - negative, normal resp flora  Respiratory failure  Due to pontine ICH  On vent  CCM on board  Not breathing over the vent  Due to neuro decline, will postpone trach  Family in discussion about GOC   Hypertension / septic shock  Stable now   off Cleviprex  Off neo  BP goal < 160 Long-term BP goal normotensive  Hyperlipidemia  Home meds:  No lipid lowering medications PTA  LDL 241, goal < 70  Continue lipitor 40mg  daily.  Continue statin at discharge  Other Stroke Risk Factors  Advanced age  Other Active Problems  3 mm left P com aneurysm.  Hospital day # 6   This patient is critically ill due to brainstem ICH, fever, leukocytosis, HLD and at significant risk of neurological worsening, death form  recurrent ICH, brain herniation, hydrocephalus. This patient's care requires constant monitoring of vital signs, hemodynamics, respiratory and cardiac monitoring, review of multiple databases, neurological assessment, discussion with family, other specialists and medical decision making of high complexity. I spent 50 minutes of neurocritical care time in the care of this patient. I again spend long time with son today and answered all his questions about potential pooor prognosis, treatment options and introduced palliative care to the son. He expressed understanding and appreciation. He will need to discuss with pt husband and daughter before making decision.   Marvel Plan, MD PhD Stroke Neurology 09/22/2016 10:42 PM    To contact Stroke Continuity provider, please refer to [email or web address]. After hours, contact General Neurology

## 2016-09-22 NOTE — Progress Notes (Signed)
Spoke with family at length prior to placement of tracheostomy.  After discussion through the translator and with the presence of a family friend who is one of our pharmacists, decision was made to make patient a full DNR and will start morphine in AM and likely withdraw on Monday.  Alyson ReedyWesam G. Yacoub, M.D. [facility]/Critical Care Medicine. Pager: [phone]. After hours pager: [phone].

## 2016-09-22 NOTE — Care Management Note (Signed)
Case Management Note  Patient Details  Name: Chelsea Schaefer MRN: [id number] Date of Birth: November 03, 1950  Subjective/Objective:  Pt lives with family, independent PTA, admitted with intracerebral hemorrhage, now unresponsive on vent - family deciding on trach.  CM met with son, Lennette Bihari, to discuss possible options if pt not weanable.  Informed son that pt would most likely need transfer to vent SNF in [place] or another state, answered son's questions.  He is planning to discuss with other family members.                           Expected Discharge Plan:  Skilled Nursing Facility  In-House Referral:  Clinical Social Work  Discharge planning Services  CM Consult  Status of Service:  In process, will continue to follow  Girard Cooter, RN 09/22/2016, 12:57 PM

## 2016-09-22 NOTE — Progress Notes (Signed)
OT Cancellation Note  Patient Details Name: Donella Stadeu Thi Tumblin MRN: [id number] DOB: 04/18/1951   Cancelled Treatment:    Reason Eval/Treat Not Completed: Patient not medically ready. OT signing off at this time. Please reorder when medically appropriate. Thanks  [facility],HILLARY  Marshon Bangs, OT/L  [phone] 09/22/2016 09/22/2016, 7:16 AM

## 2016-09-22 DEATH — deceased

## 2016-09-23 ENCOUNTER — Inpatient Hospital Stay (HOSPITAL_COMMUNITY): Payer: Medicare Other

## 2016-09-23 DIAGNOSIS — Z515 Encounter for palliative care: Secondary | ICD-10-CM | POA: Diagnosis not present

## 2016-09-23 DIAGNOSIS — I10 Essential (primary) hypertension: Secondary | ICD-10-CM | POA: Diagnosis not present

## 2016-09-23 DIAGNOSIS — I613 Nontraumatic intracerebral hemorrhage in brain stem: Secondary | ICD-10-CM | POA: Diagnosis not present

## 2016-09-23 DIAGNOSIS — J9601 Acute respiratory failure with hypoxia: Secondary | ICD-10-CM | POA: Diagnosis not present

## 2016-09-23 LAB — BLOOD GAS, ARTERIAL
ACID-BASE EXCESS: 1.4 mmol/L (ref 0.0–2.0)
BICARBONATE: 24.3 mmol/L (ref 20.0–28.0)
Drawn by: 311011
FIO2: 30
LHR: 15 {breaths}/min
O2 Saturation: 98.6 %
PEEP/CPAP: 5 cmH2O
PO2 ART: 105 mmHg (ref 83.0–108.0)
Patient temperature: 98.6
VT: 440 mL
pCO2 arterial: 31.1 mmHg — ABNORMAL LOW (ref 32.0–48.0)
pH, Arterial: 7.505 — ABNORMAL HIGH (ref 7.350–7.450)

## 2016-09-23 LAB — CBC WITH DIFFERENTIAL/PLATELET
BASOS PCT: 0 %
Basophils Absolute: 0 10*3/uL (ref 0.0–0.1)
EOS PCT: 0 %
Eosinophils Absolute: 0 10*3/uL (ref 0.0–0.7)
HEMATOCRIT: 32.2 % — AB (ref 36.0–46.0)
HEMOGLOBIN: 10.9 g/dL — AB (ref 12.0–15.0)
LYMPHS ABS: 1.4 10*3/uL (ref 0.7–4.0)
LYMPHS PCT: 6 %
MCH: 25 pg — AB (ref 26.0–34.0)
MCHC: 33.9 g/dL (ref 30.0–36.0)
MCV: 73.9 fL — AB (ref 78.0–100.0)
MONOS PCT: 2 %
Monocytes Absolute: 0.5 10*3/uL (ref 0.1–1.0)
NEUTROS ABS: 21.6 10*3/uL — AB (ref 1.7–7.7)
Neutrophils Relative %: 92 %
Platelets: 178 10*3/uL (ref 150–400)
RBC: 4.36 MIL/uL (ref 3.87–5.11)
RDW: 15.4 % (ref 11.5–15.5)
WBC: 23.5 10*3/uL — ABNORMAL HIGH (ref 4.0–10.5)

## 2016-09-23 LAB — GLUCOSE, CAPILLARY
GLUCOSE-CAPILLARY: 181 mg/dL — AB (ref 65–99)
Glucose-Capillary: 107 mg/dL — ABNORMAL HIGH (ref 65–99)
Glucose-Capillary: 149 mg/dL — ABNORMAL HIGH (ref 65–99)
Glucose-Capillary: 152 mg/dL — ABNORMAL HIGH (ref 65–99)
Glucose-Capillary: 158 mg/dL — ABNORMAL HIGH (ref 65–99)
Glucose-Capillary: 160 mg/dL — ABNORMAL HIGH (ref 65–99)
Glucose-Capillary: 163 mg/dL — ABNORMAL HIGH (ref 65–99)
Glucose-Capillary: 164 mg/dL — ABNORMAL HIGH (ref 65–99)

## 2016-09-23 LAB — RENAL FUNCTION PANEL
Albumin: 1.9 g/dL — ABNORMAL LOW (ref 3.5–5.0)
Anion gap: 9 (ref 5–15)
BUN: 19 mg/dL (ref 6–20)
CALCIUM: 8.2 mg/dL — AB (ref 8.9–10.3)
CHLORIDE: 103 mmol/L (ref 101–111)
CO2: 24 mmol/L (ref 22–32)
Creatinine, Ser: 0.71 mg/dL (ref 0.44–1.00)
Glucose, Bld: 158 mg/dL — ABNORMAL HIGH (ref 65–99)
Phosphorus: 1.6 mg/dL — ABNORMAL LOW (ref 2.5–4.6)
Potassium: 3.3 mmol/L — ABNORMAL LOW (ref 3.5–5.1)
SODIUM: 136 mmol/L (ref 135–145)

## 2016-09-23 LAB — MAGNESIUM: MAGNESIUM: 2.1 mg/dL (ref 1.7–2.4)

## 2016-09-23 MED ORDER — POTASSIUM PHOSPHATES 15 MMOLE/5ML IV SOLN
30.0000 mmol | Freq: Once | INTRAVENOUS | Status: AC
  Administered 2016-09-23: 30 mmol via INTRAVENOUS
  Filled 2016-09-23: qty 10

## 2016-09-23 MED ORDER — HYDRALAZINE HCL 20 MG/ML IJ SOLN
10.0000 mg | INTRAMUSCULAR | Status: DC | PRN
  Administered 2016-09-23 – 2016-09-24 (×5): 20 mg via INTRAVENOUS
  Filled 2016-09-23 (×5): qty 1

## 2016-09-23 MED ORDER — POTASSIUM CHLORIDE 20 MEQ/15ML (10%) PO SOLN
40.0000 meq | Freq: Once | ORAL | Status: AC
  Administered 2016-09-23: 40 meq
  Filled 2016-09-23: qty 30

## 2016-09-23 MED ORDER — MORPHINE SULFATE (PF) 2 MG/ML IV SOLN
2.0000 mg | INTRAVENOUS | Status: DC | PRN
  Administered 2016-09-24 – 2016-09-25 (×4): 2 mg via INTRAVENOUS
  Filled 2016-09-23 (×4): qty 1

## 2016-09-23 MED ORDER — POTASSIUM CHLORIDE 20 MEQ/15ML (10%) PO SOLN: 40.0000 meq | Freq: Three times a day (TID) | ORAL | Status: DC

## 2016-09-23 NOTE — Progress Notes (Signed)
Nutrition Follow-up  DOCUMENTATION CODES:   Not applicable  INTERVENTION:  Continue Vital 1.2 @ 55 mL/hour (1320 mL/day).  This tube feed regimen provides 1584 kcal, 99 grams of protein and 1069 mL of free water daily.   NUTRITION DIAGNOSIS:   Inadequate oral intake related to inability to eat as evidenced by NPO status.  Ongoing  GOAL:   Patient will meet greater than or equal to 90% of their needs  Met  MONITOR:   Vent status, Labs, Weight trends, I & O's  REASON FOR ASSESSMENT:   Consult Enteral/tube feeding initiation and management  ASSESSMENT:   66 y.o. female who was found unresponsive at home. She was transported to the hospital. CT head shows pontine hemorrhage. She is current intubated. Family is not present at bedside. Nursing reports she does not speak English per family.  Patient remains intubated on vent support.  Mv: 8.4 L/min Temp (24hrs), Avg:101 F (38.3 C), Min:99.3 F (37.4 C), Max:102.9 F (39.4 C)  Noted phos still low (1.6), being monitored daily per CCM.Per MD note, family decided comfort care on Monday. Per RN, patient will continue Tube feed.  Meds Reviewed: Novolog, Lipitor  Labs Reviewed: CBG 164, Potassium 3.3, Phosphrous 1.6, Albumin 1.9  Diet Order:  Diet NPO time specified  Skin:  Reviewed, no issues  Last BM:  3/2  Height:   Ht Readings from Last 1 Encounters:  09/14/2016 _0  (1.575 m)    Weight:   Wt Readings from Last 1 Encounters:  09/23/16 159 lb 6.3 oz (72.3 kg)    Ideal Body Weight:  50 kg  BMI:  Body mass index is 29.15 kg/m.  Estimated Nutritional Needs:   Kcal:  1545  Protein:  85-100 grams (1.3-1.5 g/kg)  Fluid:  >/= 1.5 L/d  EDUCATION NEEDS:   No education needs identified at this time  Juliann Pulse M.S. Nutrition Dietetic Intern

## 2016-09-23 NOTE — Progress Notes (Signed)
STROKE TEAM PROGRESS NOTE   SUBJECTIVE (INTERVAL HISTORY) Son and other family at bedside. Pt continued to have high grade fever and now less responsive than yesterday. Family decided comfort care on Monday.    OBJECTIVE Temp:  [99.3 F (37.4 C)-102.9 F (39.4 C)] 102.7 F (39.3 C) (03/02 1200) Pulse Rate:  [76-91] 82 (03/02 1400) Cardiac Rhythm: Normal sinus rhythm (03/02 1200) Resp:  [10-22] 18 (03/02 1400) BP: (129-210)/(51-144) 166/59 (03/02 1400) SpO2:  [94 %-100 %] 99 % (03/02 1400) FiO2 (%):  [30 %] 30 % (03/02 1200) Weight:  [159 lb 6.3 oz (72.3 kg)] 159 lb 6.3 oz (72.3 kg) (03/02 0451)  CBC:   Recent Labs Lab 09/22/16 0319 09/23/16 0338  WBC 34.2* 23.5*  NEUTROABS 30.4* 21.6*  HGB 9.8* 10.9*  HCT 30.3* 32.2*  MCV 75.4* 73.9*  PLT 236 178    Basic Metabolic Panel:   Recent Labs Lab 09/22/16 0319 09/22/16 1017 09/23/16 0338  NA 141  --  136  K 2.8* 3.8 3.3*  CL 109  --  103  CO2 26  --  24  GLUCOSE 159*  --  158*  BUN 21*  --  19  CREATININE 0.80  --  0.71  CALCIUM 8.1*  --  8.2*  MG 1.9  --  2.1  PHOS 1.6*  1.5* 2.7 1.6*    Lipid Panel:     Component Value Date/Time   CHOL 323 (H) 02-08-2017 0806   TRIG 240 (H) 09/22/2016 1017   HDL 37 (L) 02-08-2017 0806   CHOLHDL 8.7 02-08-2017 0806   VLDL 45 (H) 02-08-2017 0806   LDLCALC 241 (H) 02-08-2017 0806   HgbA1c:  Lab Results  Component Value Date   HGBA1C 5.9 (H) 09/17/2016   Urine Drug Screen:     Component Value Date/Time   LABOPIA NONE DETECTED 02-08-2017 1930   COCAINSCRNUR NONE DETECTED 02-08-2017 1930   LABBENZ NONE DETECTED 02-08-2017 1930   AMPHETMU NONE DETECTED 02-08-2017 1930   THCU NONE DETECTED 02-08-2017 1930   LABBARB NONE DETECTED 02-08-2017 1930      IMAGING I have personally reviewed the radiological images below and agree with the radiology interpretations.  Ct Head Wo Contrast 02/11/2017 1. Central pontine hematoma is essentially size stable from earlier today.  Contiguous clot dissecting in the central midbrain and along the fourth ventricular floor is mildly increased. Patent fourth ventricle and no hydrocephalus.  2. Chronic microvascular disease.   Dg Chest Portable 1 View 02/11/2017 1. Endotracheal tube tip at the right mainstem bronchus.  2. Left lower lobe atelectasis could be from #1 or aspiration.   Ct Head Code Stroke W/o Cm 02/11/2017 1. 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 Early extension into the left cerebellar peduncle and area postrema. No intraventricular or subarachnoid extension. Patent basilar cisterns. No significant intracranial mass effect at this time.  2. Underlying chronic small vessel disease.  3. ASPECTS is not applicable; acute hemorrhage.   TTE  09/17/2016 Study Conclusions - Left ventricle: The cavity size was normal. Wall thickness was   increased in a pattern of mild LVH. Systolic function was   vigorous. The estimated ejection fraction was in the range of 65% to 70%.    Wall motion was normal; there were no regional wall   motion abnormalities. Doppler parameters are consistent with   abnormal left ventricular relaxation (grade 1 diastolic   dysfunction). - Aortic valve: Valve area (VTI): 1.77 cm^2. Valve area (Vmax):  1.37 cm^2. Valve area (Vmean): 1.79 cm^2. - Atrial septum: No defect or patent foramen ovale was identified. - Technically adequate study.  CTA Head and Neck 09/18/2016  CTA NECK  1. Negative CTA of the head and neck. No flow limiting or critical stenosis identified. 2. Emphysema.  CTA HEAD  1. Stable size of acute pontine hemorrhage with slightly increased localized vasogenic edema.  There is mildly increased mass effect on the fourth ventricle posteriorly with slightly increased ventricular dilatation. No frank hydrocephalus. Continued close interval monitoring is warranted. 2. No aneurysm or other vascular abnormality identified underlying the acute  pontine hemorrhage. 3. 3 mm left P com aneurysm. 4. Mild atheromatous plaque involving the cavernous ICAs and right V4 segment without high-grade stenosis.  Ct Head Wo Contrast 09/19/2016 IMPRESSION: Size stable brainstem hematoma.  Stable normal ventricular volume.   Dg Chest Port 1 View 09/19/2016 IMPRESSION: 1. Lines and tubes in stable position. 2. Persistent bibasilar subsegmental atelectasis and small left pleural effusion. No significant change.     Ct Head Wo Contrast 09/22/2016 IMPRESSION: 1. Large brainstem hematoma grossly stable compared to prior examination from 09/19/2016, as above. 2. Chronic microvascular ischemic changes in the cerebral white matter redemonstrated. 3. Cerebrovascular atherosclerosis.   Dg Chest Port 1 View 09/22/2016 IMPRESSION: Stable support apparatus. Small left pleural effusion with left basilar atelectasis. No infiltrate or pulmonary edema.     PHYSICAL EXAM  Temp:  [99.3 F (37.4 C)-102.9 F (39.4 C)] 102.7 F (39.3 C) (03/02 1200) Pulse Rate:  [76-91] 82 (03/02 1400) Resp:  [10-22] 18 (03/02 1400) BP: (129-210)/(51-144) 166/59 (03/02 1400) SpO2:  [94 %-100 %] 99 % (03/02 1400) FiO2 (%):  [30 %] 30 % (03/02 1200) Weight:  [159 lb 6.3 oz (72.3 kg)] 159 lb 6.3 oz (72.3 kg) (03/02 0451)  General - Well nourished, well developed, intubated, not on sedation.  Ophthalmologic - Fundi not visualized.  Cardiovascular - RRR, no m/g/r  Neuro - intubated off sedation, does not open eyes on voice or pain. Pupils bilateral 2mm, briskly reactive to light. Sluggish doll's eyes, no corneal. Positive gag and cough. On pain stimulation, no significant movement on all extremities. Babinski negative bilaterally. Sensation, coordination or gait not tested.    ASSESSMENT/PLAN [name] is a 66 y.o. female with history of hypertension and arthritis presenting with unresponsiveness. She did not receive IV t-PA due to hemorrhagic stroke.  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   Resultant  coma  CT head - central pontine hemorrhage bilaterally  CT repeat x 3 - stable hematoma and no hydrocephalus  CTA H&N - Stable hematoma and no hydrocephalus. No AVM. 3mm left PCOM aneurysm  2D Echo - EF 65-70%. No cardiac source of emboli identified.  LDL - 241  HgbA1c - 5.9  VTE prophylaxis - SCDs Diet NPO time specified  No antithrombotic prior to admission, now on No antithrombotic  Ongoing aggressive stroke risk factor management  Therapy recommendations: pending  Disposition: Pending  Fever and leukocystosis - ? sepsis  High grade fever continues   On cooling blanket  With continued neuro decline  WBC 17.3->20.5 -> 27-> 22.7--> 18.6-->12.6->34.2->23.5  UA negative  CXR - atelectasis  B Cx - negative  Sputum Cx - negative, normal resp flora  Respiratory failure  Due to pontine ICH  On vent  CCM on board  Not breathing over the vent  Family decided comfort care on Monday   Hypertension  BP high  Comfort care measures underway  Hyperlipidemia  Home meds:  No lipid lowering medications PTA  LDL 241, goal < 70  Other Stroke Risk Factors  Advanced age  Other Active Problems  3 mm left P com aneurysm.  Hospital day # 7   Marvel Plan, MD PhD Stroke Neurology 09/23/2016 3:00 PM    To contact Stroke Continuity provider, please refer to [email or web address]. After hours, contact General Neurology

## 2016-09-23 NOTE — Progress Notes (Addendum)
[place] [facility] Critical [facility] Rounding Note  Presenting HPI:  66 y.o.  female w/ no sig history. Was in usual health until am of admit. Pt awoke around 0700 went to RR. About 0730 she was found unresponsive on the floor. On arrival to ER she would withdrawal to pain. Was hypertensive w/ SBP in 170-180s. CT head + for pontine ICH. PCCM asked to admit.  Subjective:  No events overnight, remains completely unresponsive  Objective:  Vent Mode: PRVC FiO2 (%):  [30 %] 30 % Set Rate:  [15 bmp] 15 bmp Vt Set:  [440 mL] 440 mL PEEP:  [5 cmH20] 5 cmH20 Pressure Support:  [8 cmH20] 8 cmH20 Plateau Pressure:  [11 cmH20-17 cmH20] 17 cmH20  Temp:  [99.3 F (37.4 C)-102.9 F (39.4 C)] 99.5 F (37.5 C) (03/02 0746) Pulse Rate:  [70-91] 82 (03/02 1100) Resp:  [10-21] 21 (03/02 1100) BP: (114-194)/(51-144) 178/144 (03/02 1100) SpO2:  [94 %-100 %] 100 % (03/02 1100) FiO2 (%):  [30 %] 30 % (03/02 0825) Weight:  [72.3 kg (159 lb 6.3 oz)] 72.3 kg (159 lb 6.3 oz) (03/02 0451)  Physical Exam: General:  Female of normal body habitus, comatose. Neuro:  Unresponsive on vent.  No spontaneous motion.  HEENT:  [place]/AT, No JVD noted, PERRL Cardiovascular:  RRR, Nl S1/S2, -M/R/G. Lungs:  CTA bilaterally Abdomen:  Soft, NT, ND and +BS Musculoskeletal:  No acute deformity Skin:  Intact, MMM  LINES/TUBES: OETT 7.5 2/23 >>> Foley 2/25 >>> OGT 2/23 >>> PIV>>>  CBC Latest Ref Rng & Units 09/23/2016 09/22/2016 09/21/2016  WBC 4.0 - 10.5 K/uL 23.5(H) 34.2(H) 12.6(H)  Hemoglobin 12.0 - 15.0 g/dL 10.9(L) 9.8(L) 12.6  Hematocrit 36.0 - 46.0 % 32.2(L) 30.3(L) 37.6  Platelets 150 - 400 K/uL 178 236 309   BMP Latest Ref Rng & Units 09/23/2016 09/22/2016 09/22/2016  Glucose 65 - 99 mg/dL 158(H) - 159(H)  BUN 6 - 20 mg/dL 19 - 21(H)  Creatinine 0.44 - 1.00 mg/dL 0.71 - 0.80  Sodium 135 - 145 mmol/L 136 - 141  Potassium 3.5 - 5.1 mmol/L 3.3(L) 3.8 2.8(L)  Chloride 101 - 111 mmol/L 103 - 109  CO2 22 - 32 mmol/L 24 - 26   Calcium 8.9 - 10.3 mg/dL 8.2(L) - 8.1(L)   Hepatic Function Latest Ref Rng & Units 09/23/2016 09/22/2016 09/21/2016  Total Protein 6.5 - 8.1 g/dL - - -  Albumin 3.5 - 5.0 g/dL 1.9(L) 2.0(L) 2.5(L)  AST 15 - 41 U/L - - -  ALT 14 - 54 U/L - - -  Alk Phosphatase 38 - 126 U/L - - -  Total Bilirubin 0.3 - 1.2 mg/dL - - -   IMAGING/STUDIES: CT HEAD W/O 2/23:  Central pontine hematoma is essentially size stable from earlier today. Contiguous clot dissecting in the central midbrain and along the fourth ventricular floor is mildly increased. Patent fourth ventricle and no hydrocephalus. Chronic microvascular disease.  TTE 2/24: Mild LVH with normal cavity size, EF 65-70%, and grade 1 diastolic dysfunction. No aortic stenosis or regurgitation. Aortic root normal in size. No mitral stenosis or regurgitation. No pulmonic regurgitation or stenosis. Trivial tricuspid regurgitation without stenosis. No pericardial effusion.  CTA HEAD/NECK 2/25:  Negative CTA of the head and neck. No flow limiting or critical stenosis identified. Emphysema. Stable size of acute pontine hemorrhage with slightly increased localized vasogenic edema. There is mildly increased mass effect on the fourth ventricle posteriorly with slightly increased ventricular dilatation. No frank hydrocephalus. 3 mm left  P com aneurysm.  CT HEAD W/O 2/26:  Size stable brainstem hematoma.  Stable normal ventricular volume.  PORT CXR 2/26:  Personally reviewed by me. Low lung volumes. No focal opacity appreciated. Endotracheal and enteric feeding tubes in good position.  MICROBIOLOGY: MRSA PCR 2/23:  Negative  Blood Cultures x2 2/24 >>> Tracheal Aspirate Culture 2/24 >>>  ANTIBIOTICS: None.  SIGNIFICANT EVENTS: 02/23 - Admit  ASSESSMENT/PLAN:  66 y.o. female with acute central pontine hemorrhage and hypertensive emergency. Patient continues to exhibit no spontaneous respirations and exam remains poor.   Acute, nontraumatic central pontine  intracerebral hemorrhage: Neurosurgery - no indication for intraventricular drain placement. - Maintain comfortable at this point with PRN morphine. - No trach/peg after discussion on 3/1.  Hypertensive emergency. - Will not change medication treatment for BP at this point given that the plan is to withdraw on Monday. - Continue amlodipine and losartan per tube. - Continue coreg 6.52m BID in attempt to wean drip  Acute hypoxic respiratory failure: apneic on SBT. - No trach/peg per family wishes - No weaning - Withdraw on Monday. - Full DNR  SIRS: Multifactorial. Procalcitonin remains < 0.25. - No significant growth to date on cultures, continue to follow - Continue to hold empiric abx.  Hyperglycemia:  Hgb A1C 5.9 on 2/24.  - Continue Accuchecks q 4 hours and SSI.  Prophylaxis: Protonix IV.  D/c subq heparin given ICH. Diet: Tube feeds per dietary Code Status: Full code per previous physician discussion. Disposition: Critically ill in ICU Family Update: Son updated bedside, will maintain on full support for now.  Plan on withdraw on Monday.  Does not wish for any morphine for now, informed that we will leave as PRN and to let uKoreaknow if he sees signs of distress.  The patient is critically ill with multiple organ systems failure and requires high complexity decision making for assessment and support, frequent evaluation and titration of therapies, application of advanced monitoring technologies and extensive interpretation of multiple databases.   Critical Care Time devoted to patient care services described in this note is  45  Minutes. This time reflects time of care of this signee Dr WJennet Maduro This critical care time does not reflect procedure time, or teaching time or supervisory time of PA/NP/Med student/Med Resident etc but could involve care discussion time.  WRush Farmer M.D. LUniversity Of Maryland Medicine Asc LLCPulmonary/Critical Care Medicine. Pager: [phone] After hours pager:  [phone]

## 2016-09-23 NOTE — Care Management Note (Signed)
Case Management Note  Patient Details  Name: Chelsea Schaefer MRN: [id number] Date of Birth: November 16, 1950  Subjective/Objective:  CM had lengthy conversation with son, Caryn BeeKevin, previously, regarding eventual discharge options.  He has talked with family and they are choosing comfort care vs trach and PEG.               Discharge planning Services  CM Consult  Status of Service:  In process, will continue to follow  Jamelle RushingMayo, Jamielyn Petrucci T, RN 09/23/2016, 3:27 PM

## 2016-09-23 NOTE — Progress Notes (Signed)
eLink Physician-Brief Progress Note Patient Name: Donella Stadeu Thi Borchers DOB: [birth year] MRN: [id number]   Date of Service  09/23/2016  HPI/Events of Note  Hypokalemia and hypophosphatemia  eICU Interventions  Potassium and phos replaced     Intervention Category Intermediate Interventions: Electrolyte abnormality - evaluation and management  Lux Meaders 09/23/2016, 4:52 AM

## 2016-09-24 ENCOUNTER — Inpatient Hospital Stay (HOSPITAL_COMMUNITY): Payer: Medicare Other

## 2016-09-24 DIAGNOSIS — I161 Hypertensive emergency: Secondary | ICD-10-CM | POA: Diagnosis not present

## 2016-09-24 DIAGNOSIS — J9600 Acute respiratory failure, unspecified whether with hypoxia or hypercapnia: Secondary | ICD-10-CM | POA: Diagnosis not present

## 2016-09-24 DIAGNOSIS — R293 Abnormal posture: Secondary | ICD-10-CM

## 2016-09-24 DIAGNOSIS — G253 Myoclonus: Secondary | ICD-10-CM

## 2016-09-24 DIAGNOSIS — I613 Nontraumatic intracerebral hemorrhage in brain stem: Secondary | ICD-10-CM | POA: Diagnosis not present

## 2016-09-24 LAB — CBC WITH DIFFERENTIAL/PLATELET
BASOS PCT: 0 %
Basophils Absolute: 0 10*3/uL (ref 0.0–0.1)
EOS PCT: 0 %
Eosinophils Absolute: 0 10*3/uL (ref 0.0–0.7)
HEMATOCRIT: 30.7 % — AB (ref 36.0–46.0)
HEMOGLOBIN: 10.6 g/dL — AB (ref 12.0–15.0)
LYMPHS ABS: 1.4 10*3/uL (ref 0.7–4.0)
Lymphocytes Relative: 6 %
MCH: 25.3 pg — ABNORMAL LOW (ref 26.0–34.0)
MCHC: 34.5 g/dL (ref 30.0–36.0)
MCV: 73.3 fL — ABNORMAL LOW (ref 78.0–100.0)
MONO ABS: 0.2 10*3/uL (ref 0.1–1.0)
MONOS PCT: 1 %
NEUTROS ABS: 21.8 10*3/uL — AB (ref 1.7–7.7)
Neutrophils Relative %: 93 %
Platelets: 181 10*3/uL (ref 150–400)
RBC: 4.19 MIL/uL (ref 3.87–5.11)
RDW: 15.7 % — AB (ref 11.5–15.5)
WBC: 23.4 10*3/uL — AB (ref 4.0–10.5)

## 2016-09-24 LAB — GLUCOSE, CAPILLARY
GLUCOSE-CAPILLARY: 160 mg/dL — AB (ref 65–99)
Glucose-Capillary: 163 mg/dL — ABNORMAL HIGH (ref 65–99)
Glucose-Capillary: 161 mg/dL — ABNORMAL HIGH (ref 65–99)
Glucose-Capillary: 164 mg/dL — ABNORMAL HIGH (ref 65–99)
Glucose-Capillary: 152 mg/dL — ABNORMAL HIGH (ref 65–99)

## 2016-09-24 LAB — BLOOD GAS, ARTERIAL
ACID-BASE DEFICIT: 1.1 mmol/L (ref 0.0–2.0)
Bicarbonate: 21.4 mmol/L (ref 20.0–28.0)
DRAWN BY: 330991
FIO2: 30
O2 SAT: 97.3 %
PATIENT TEMPERATURE: 101
PCO2 ART: 28 mmHg — AB (ref 32.0–48.0)
PEEP: 5 cmH2O
PH ART: 7.502 — AB (ref 7.350–7.450)
RATE: 15 resp/min
VT: 440 mL
pO2, Arterial: 88.2 mmHg (ref 83.0–108.0)

## 2016-09-24 LAB — RENAL FUNCTION PANEL
Albumin: 1.7 g/dL — ABNORMAL LOW (ref 3.5–5.0)
Anion gap: 11 (ref 5–15)
BUN: 23 mg/dL — AB (ref 6–20)
CHLORIDE: 104 mmol/L (ref 101–111)
CO2: 22 mmol/L (ref 22–32)
CREATININE: 0.84 mg/dL (ref 0.44–1.00)
Calcium: 8.2 mg/dL — ABNORMAL LOW (ref 8.9–10.3)
GFR calc Af Amer: >60 mL/min (ref >60)
GLUCOSE: 157 mg/dL — AB (ref 65–99)
POTASSIUM: 3.9 mmol/L (ref 3.5–5.1)
Phosphorus: 1.6 mg/dL — ABNORMAL LOW (ref 2.5–4.6)
Sodium: 137 mmol/L (ref 135–145)

## 2016-09-24 LAB — PHOSPHORUS: PHOSPHORUS: 1.9 mg/dL — AB (ref 2.5–4.6)

## 2016-09-24 LAB — MAGNESIUM: Magnesium: 2.3 mg/dL (ref 1.7–2.4)

## 2016-09-24 MED ORDER — LABETALOL HCL 5 MG/ML IV SOLN
10.0000 mg | INTRAVENOUS | Status: DC | PRN
  Administered 2016-09-24 (×2): 20 mg via INTRAVENOUS
  Administered 2016-09-25 (×2): 10 mg via INTRAVENOUS
  Filled 2016-09-24 (×4): qty 4

## 2016-09-24 MED ORDER — VALPROATE SODIUM 500 MG/5ML IV SOLN
500.0000 mg | Freq: Three times a day (TID) | INTRAVENOUS | Status: DC
  Administered 2016-09-24 – 2016-09-26 (×6): 500 mg via INTRAVENOUS
  Filled 2016-09-24 (×9): qty 5

## 2016-09-24 MED ORDER — MIDAZOLAM HCL 2 MG/2ML IJ SOLN
1.0000 mg | INTRAMUSCULAR | Status: DC | PRN
Start: 2016-09-24 — End: 2016-09-26
  Administered 2016-09-24 (×2): 2 mg via INTRAVENOUS
  Filled 2016-09-24 (×2): qty 2

## 2016-09-24 MED ORDER — HYDRALAZINE HCL 20 MG/ML IJ SOLN
10.0000 mg | INTRAMUSCULAR | Status: DC | PRN
Start: 2016-09-24 — End: 2016-09-26
  Administered 2016-09-24 – 2016-09-25 (×2): 20 mg via INTRAVENOUS
  Filled 2016-09-24 (×2): qty 1

## 2016-09-24 NOTE — Progress Notes (Signed)
Pulmonary & Critical Care Rounding Note  Presenting HPI:  66 y.o.  female w/ no sig history. Was in usual health until am of admit. Pt awoke around 0700 went to RR. About 0730 she was found unresponsive on the floor. On arrival to ER she would withdrawal to pain. Was hypertensive w/ SBP in 170-180s. CT head + for pontine ICH. PCCM asked to admit.  Subjective:   Unresponsive w/ on-going myoclonus and posturing  Objective:  Vent Mode: PRVC FiO2 (%):  [30 %] 30 % Set Rate:  [15 bmp] 15 bmp Vt Set:  [440 mL] 440 mL PEEP:  [5 cmH20] 5 cmH20 Plateau Pressure:  [20 cmH20] 20 cmH20  Temp:  [98.8 F (37.1 C)-102.7 F (39.3 C)] 98.8 F (37.1 C) (03/03 0800) Pulse Rate:  [80-107] 88 (03/03 0800) Resp:  [16-26] 20 (03/03 0800) BP: (138-210)/(50-144) 173/67 (03/03 0800) SpO2:  [96 %-100 %] 99 % (03/03 0800) FiO2 (%):  [30 %] 30 % (03/03 0457) Weight:  [165 lb 12.6 oz (75.2 kg)] 165 lb 12.6 oz (75.2 kg) (03/03 0500)  Physical Exam: General: 66 year old female, unresponsive on vent w/ on-going myoclonus and posturing  Neuro:  Unresponsive w/ on-going myoclonus  HEENT: PERRL, orally intubated no JVD Cardiovascular: RRR w/out MRG Lungs:  Scattered rhonchi, equal chest rise on vent  Abdomen:soft, tolerating tube feeds. + bowel sounds  Musculoskeletal: no deformities. Equal st and bulk Skin:  Warm and dry   LINES/TUBES: OETT 7.5 2/23 >>> Foley 2/25 >>> OGT 2/23 >>> PIV>>>  CBC Latest Ref Rng & Units 09/24/2016 09/23/2016 09/22/2016  WBC 4.0 - 10.5 K/uL 23.4(H) 23.5(H) 34.2(H)  Hemoglobin 12.0 - 15.0 g/dL 10.6(L) 10.9(L) 9.8(L)  Hematocrit 36.0 - 46.0 % 30.7(L) 32.2(L) 30.3(L)  Platelets 150 - 400 K/uL 181 178 236   BMP Latest Ref Rng & Units 09/24/2016 09/23/2016 09/22/2016  Glucose 65 - 99 mg/dL 157(H) 158(H) -  BUN 6 - 20 mg/dL 23(H) 19 -  Creatinine 0.44 - 1.00 mg/dL 0.84 0.71 -  Sodium 135 - 145 mmol/L 137 136 -  Potassium 3.5 - 5.1 mmol/L 3.9 3.3(L) 3.8  Chloride 101 - 111 mmol/L  104 103 -  CO2 22 - 32 mmol/L 22 24 -  Calcium 8.9 - 10.3 mg/dL 8.2(L) 8.2(L) -   Hepatic Function Latest Ref Rng & Units 09/24/2016 09/23/2016 09/22/2016  Total Protein 6.5 - 8.1 g/dL - - -  Albumin 3.5 - 5.0 g/dL 1.7(L) 1.9(L) 2.0(L)  AST 15 - 41 U/L - - -  ALT 14 - 54 U/L - - -  Alk Phosphatase 38 - 126 U/L - - -  Total Bilirubin 0.3 - 1.2 mg/dL - - -   IMAGING/STUDIES: CT HEAD W/O 2/23:  Central pontine hematoma is essentially size stable from earlier today. Contiguous clot dissecting in the central midbrain and along the fourth ventricular floor is mildly increased. Patent fourth ventricle and no hydrocephalus. Chronic microvascular disease.  TTE 2/24: Mild LVH with normal cavity size, EF 65-70%, and grade 1 diastolic dysfunction. No aortic stenosis or regurgitation. Aortic root normal in size. No mitral stenosis or regurgitation. No pulmonic regurgitation or stenosis. Trivial tricuspid regurgitation without stenosis. No pericardial effusion.  CTA HEAD/NECK 2/25:  Negative CTA of the head and neck. No flow limiting or critical stenosis identified. Emphysema. Stable size of acute pontine hemorrhage with slightly increased localized vasogenic edema. There is mildly increased mass effect on the fourth ventricle posteriorly with slightly increased ventricular dilatation. No frank  hydrocephalus. 3 mm left P com aneurysm.  CT HEAD W/O 2/26:  Size stable brainstem hematoma.  Stable normal ventricular volume.  PORT CXR 2/26:  Personally reviewed by me. Low lung volumes. No focal opacity appreciated. Endotracheal and enteric feeding tubes in good position.  MICROBIOLOGY: MRSA PCR 2/23:  Negative  Blood Cultures x2 2/24 >>> Tracheal Aspirate Culture 2/24 >>>normal flora   ANTIBIOTICS: None.  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 She is having on-going myoclonus and posturing.  Plan Add scheduled Valproic acid Continue PRN morphine Cont supportive care w/ plan for wd on Monday  Hypertensive emergency. (now controlled) Plan Cont coreg, amlodipine and losartan  Will dc gtt from profile No escalation  Acute hypoxic respiratory failure  Plan Terminal wean Monday Cont full vent support  Hyperglycemia  Plan ssi   Mild SIRS w/out active infection Plan Monitor    Prophylaxis: Protonix IV.  D/c subq heparin given ICH. Diet: Tube feeds per dietary Code Status: Full code per previous physician discussion. Disposition: Critically ill in ICU Family Update: Son at bedside. Working on Mudlogger of myoclonus. Morphine PRN. Can re-evaluate morphine gtt at any point but family not wanting that yet  66 year old w/ devastating ICH. Has on-going myoclonus and posturing. Family wishes to wd Monday. Will focus on comfort and trying to control myoclonus. Family had not wanted morphine gtt yet. Will cont PRN but may need to reconsider this.   My critical care time 31 minutes.   Erick Colace ACNP-BC [place] Pager # [phone] OR # [phone] if no answer

## 2016-09-25 DIAGNOSIS — I613 Nontraumatic intracerebral hemorrhage in brain stem: Secondary | ICD-10-CM | POA: Diagnosis not present

## 2016-09-25 DIAGNOSIS — I10 Essential (primary) hypertension: Secondary | ICD-10-CM | POA: Diagnosis not present

## 2016-09-25 DIAGNOSIS — J9600 Acute respiratory failure, unspecified whether with hypoxia or hypercapnia: Secondary | ICD-10-CM | POA: Diagnosis not present

## 2016-09-25 LAB — CBC WITH DIFFERENTIAL/PLATELET
BASOS PCT: 0 %
Basophils Absolute: 0 10*3/uL (ref 0.0–0.1)
EOS ABS: 0 10*3/uL (ref 0.0–0.7)
Eosinophils Relative: 0 %
HCT: 30.1 % — ABNORMAL LOW (ref 36.0–46.0)
Hemoglobin: 10.2 g/dL — ABNORMAL LOW (ref 12.0–15.0)
Lymphocytes Relative: 7 %
Lymphs Abs: 1.5 10*3/uL (ref 0.7–4.0)
MCH: 24.7 pg — AB (ref 26.0–34.0)
MCHC: 33.9 g/dL (ref 30.0–36.0)
MCV: 72.9 fL — AB (ref 78.0–100.0)
MONO ABS: 0.9 10*3/uL (ref 0.1–1.0)
Monocytes Relative: 4 %
NEUTROS ABS: 19.5 10*3/uL — AB (ref 1.7–7.7)
Neutrophils Relative %: 89 %
PLATELETS: 211 10*3/uL (ref 150–400)
RBC: 4.13 MIL/uL (ref 3.87–5.11)
RDW: 15.9 % — AB (ref 11.5–15.5)
WBC: 21.9 10*3/uL — ABNORMAL HIGH (ref 4.0–10.5)

## 2016-09-25 LAB — RENAL FUNCTION PANEL
ALBUMIN: 1.6 g/dL — AB (ref 3.5–5.0)
Anion gap: 9 (ref 5–15)
BUN: 30 mg/dL — ABNORMAL HIGH (ref 6–20)
CALCIUM: 8.2 mg/dL — AB (ref 8.9–10.3)
CO2: 21 mmol/L — AB (ref 22–32)
CREATININE: 0.93 mg/dL (ref 0.44–1.00)
Chloride: 107 mmol/L (ref 101–111)
GFR calc non Af Amer: >60 mL/min (ref >60)
GLUCOSE: 105 mg/dL — AB (ref 65–99)
PHOSPHORUS: 2.4 mg/dL — AB (ref 2.5–4.6)
Potassium: 4.2 mmol/L (ref 3.5–5.1)
SODIUM: 137 mmol/L (ref 135–145)

## 2016-09-25 LAB — GLUCOSE, CAPILLARY
Glucose-Capillary: 114 mg/dL — ABNORMAL HIGH (ref 65–99)
Glucose-Capillary: 121 mg/dL — ABNORMAL HIGH (ref 65–99)
Glucose-Capillary: 126 mg/dL — ABNORMAL HIGH (ref 65–99)
Glucose-Capillary: 132 mg/dL — ABNORMAL HIGH (ref 65–99)
Glucose-Capillary: 135 mg/dL — ABNORMAL HIGH (ref 65–99)
Glucose-Capillary: 135 mg/dL — ABNORMAL HIGH (ref 65–99)

## 2016-09-25 NOTE — Progress Notes (Signed)
[place] [facility] Critical Care Rounding Note Subjective:   Posturing less. No myoclonus   Objective:  Vent Mode: PRVC FiO2 (%):  [30 %] 30 % Set Rate:  [15 bmp] 15 bmp Vt Set:  [440 mL] 440 mL PEEP:  [5 cmH20] 5 cmH20  Temp:  [98.8 F (37.1 C)-100.9 F (38.3 C)] 98.8 F (37.1 C) (03/04 1000) Pulse Rate:  [71-101] 84 (03/04 1000) Resp:  [16-30] 19 (03/04 1000) BP: (112-205)/(55-84) 155/70 (03/04 1000) SpO2:  [94 %-100 %] 100 % (03/04 1000) FiO2 (%):  [30 %] 30 % (03/04 0824) Weight:  [166 lb 10.7 oz (75.6 kg)] 166 lb 10.7 oz (75.6 kg) (03/04 0600)  Physical Exam: General appearance:  66 Year old  female, well nourished unresponsive on vent Eyes: anicteric sclerae, moist conjunctivae; PERRL, EOMI bilaterally. Mouth:  membranes and no mucosal ulcerations; normal hard and soft palate, orally intubated  Neck: Trachea midline; neck supple, no JVD Lungs/chest: scattered rhonchi, with normal respiratory effort and no intercostal retractions CV: RRR, no MRGs  Abdomen: Soft, non-tender; no masses or HSM Extremities: diffuse anasarca Skin: Normal temperature, turgor and texture; no rash, ulcers or subcutaneous nodules Psych: Appropriate affect, alert and oriented to person, place and time  Skin:  Warm and dry   LINES/TUBES: OETT 7.5 2/23 >>> Foley 2/25 >>> OGT 2/23 >>> PIV>>>  CBC Latest Ref Rng & Units 09/25/2016 09/24/2016 09/23/2016  WBC 4.0 - 10.5 K/uL 21.9(H) 23.4(H) 23.5(H)  Hemoglobin 12.0 - 15.0 g/dL 10.2(L) 10.6(L) 10.9(L)  Hematocrit 36.0 - 46.0 % 30.1(L) 30.7(L) 32.2(L)  Platelets 150 - 400 K/uL 211 181 178   BMP Latest Ref Rng & Units 09/25/2016 09/24/2016 09/23/2016  Glucose 65 - 99 mg/dL 105(H) 157(H) 158(H)  BUN 6 - 20 mg/dL 30(H) 23(H) 19  Creatinine 0.44 - 1.00 mg/dL 0.93 0.84 0.71  Sodium 135 - 145 mmol/L 137 137 136  Potassium 3.5 - 5.1 mmol/L 4.2 3.9 3.3(L)  Chloride 101 - 111 mmol/L 107 104 103  CO2 22 - 32 mmol/L 21(L) 22 24  Calcium 8.9 - 10.3 mg/dL 8.2(L)  8.2(L) 8.2(L)   Hepatic Function Latest Ref Rng & Units 09/25/2016 09/24/2016 09/23/2016  Total Protein 6.5 - 8.1 g/dL - - -  Albumin 3.5 - 5.0 g/dL 1.6(L) 1.7(L) 1.9(L)  AST 15 - 41 U/L - - -  ALT 14 - 54 U/L - - -  Alk Phosphatase 38 - 126 U/L - - -  Total Bilirubin 0.3 - 1.2 mg/dL - - -   IMAGING/STUDIES: CT HEAD W/O 2/23:  Central pontine hematoma is essentially size stable from earlier today. Contiguous clot dissecting in the central midbrain and along the fourth ventricular floor is mildly increased. Patent fourth ventricle and no hydrocephalus. Chronic microvascular disease.  TTE 2/24: Mild LVH with normal cavity size, EF 65-70%, and grade 1 diastolic dysfunction. No aortic stenosis or regurgitation. Aortic root normal in size. No mitral stenosis or regurgitation. No pulmonic regurgitation or stenosis. Trivial tricuspid regurgitation without stenosis. No pericardial effusion.  CTA HEAD/NECK 2/25:  Negative CTA of the head and neck. No flow limiting or critical stenosis identified. Emphysema. Stable size of acute pontine hemorrhage with slightly increased localized vasogenic edema. There is mildly increased mass effect on the fourth ventricle posteriorly with slightly increased ventricular dilatation. No frank hydrocephalus. 3 mm left P com aneurysm.  CT HEAD W/O 2/26:  Size stable brainstem hematoma.  Stable normal ventricular volume.  PORT CXR 2/26:  Personally reviewed by me. Low lung  volumes. No focal opacity appreciated. Endotracheal and enteric feeding tubes in good position.  MICROBIOLOGY: MRSA PCR 2/23:  Negative  Blood Cultures x2 2/24 >>> Tracheal Aspirate Culture 2/24 >>>normal flora   ANTIBIOTICS: None.  SIGNIFICANT EVENTS: 02/23 - Admit 3/2 family decided on terminal wean for Monday   ASSESSMENT/PLAN:   Acute central pontine IHC Persistent vegetative state/COMA Myoclonus HTN Acute hypoxic respiratory failure  Hypertension  Discussion Appears more comfortable.  Myoclonus improved w/ depakote.  Son at bedside. Reconfirms plan to wd vent on 3/5  Plan Cont full vent support Cont valproic acid No change in antihypertensive but will dc these in am Cont ssi Plan for terminal extubation 3/5  Cont PRN morphine     Prophylaxis: Protonix IV.  D/c subq heparin given ICH. Diet: Tube feeds per dietary Code Status: Full code per previous physician discussion. Disposition: Critically ill in ICU Family Update: Son at bedside. Working on Mudlogger of myoclonus. Morphine PRN. Can re-evaluate morphine gtt at any point but family not wanting that yet   Erick Colace ACNP-BC [place] Pager # [phone] OR # [phone] if no answer

## 2016-09-26 DIAGNOSIS — J9600 Acute respiratory failure, unspecified whether with hypoxia or hypercapnia: Secondary | ICD-10-CM | POA: Diagnosis not present

## 2016-09-26 DIAGNOSIS — I613 Nontraumatic intracerebral hemorrhage in brain stem: Secondary | ICD-10-CM | POA: Diagnosis not present

## 2016-09-26 LAB — RENAL FUNCTION PANEL
ANION GAP: 7 (ref 5–15)
Albumin: 1.5 g/dL — ABNORMAL LOW (ref 3.5–5.0)
BUN: 29 mg/dL — AB (ref 6–20)
CHLORIDE: 110 mmol/L (ref 101–111)
CO2: 23 mmol/L (ref 22–32)
Calcium: 8.3 mg/dL — ABNORMAL LOW (ref 8.9–10.3)
Creatinine, Ser: 0.76 mg/dL (ref 0.44–1.00)
Glucose, Bld: 120 mg/dL — ABNORMAL HIGH (ref 65–99)
POTASSIUM: 3.7 mmol/L (ref 3.5–5.1)
Phosphorus: 2.6 mg/dL (ref 2.5–4.6)
Sodium: 140 mmol/L (ref 135–145)

## 2016-09-26 LAB — CBC WITH DIFFERENTIAL/PLATELET
Basophils Absolute: 0 10*3/uL (ref 0.0–0.1)
Basophils Relative: 0 %
EOS PCT: 0 %
Eosinophils Absolute: 0 10*3/uL (ref 0.0–0.7)
HEMATOCRIT: 28.6 % — AB (ref 36.0–46.0)
HEMOGLOBIN: 9.8 g/dL — AB (ref 12.0–15.0)
LYMPHS PCT: 6 %
Lymphs Abs: 1.2 10*3/uL (ref 0.7–4.0)
MCH: 24.5 pg — ABNORMAL LOW (ref 26.0–34.0)
MCHC: 34.3 g/dL (ref 30.0–36.0)
MCV: 71.5 fL — ABNORMAL LOW (ref 78.0–100.0)
MONOS PCT: 4 %
Monocytes Absolute: 0.8 10*3/uL (ref 0.1–1.0)
NEUTROS PCT: 90 %
Neutro Abs: 18.5 10*3/uL — ABNORMAL HIGH (ref 1.7–7.7)
Platelets: 224 10*3/uL (ref 150–400)
RBC: 4 MIL/uL (ref 3.87–5.11)
RDW: 15.2 % (ref 11.5–15.5)
WBC: 20.5 10*3/uL — AB (ref 4.0–10.5)

## 2016-09-26 LAB — GLUCOSE, CAPILLARY
GLUCOSE-CAPILLARY: 114 mg/dL — AB (ref 65–99)
GLUCOSE-CAPILLARY: 130 mg/dL — AB (ref 65–99)
GLUCOSE-CAPILLARY: 135 mg/dL — AB (ref 65–99)

## 2016-09-26 MED ORDER — SODIUM CHLORIDE 0.9 % IV SOLN
1.0000 mg/h | INTRAVENOUS | Status: DC
  Administered 2016-09-26: 1 mg/h via INTRAVENOUS
  Filled 2016-09-26: qty 5
  Filled 2016-09-26: qty 10

## 2016-09-27 ENCOUNTER — Telehealth: Payer: Self-pay

## 2016-09-27 NOTE — Telephone Encounter (Signed)
On 09/27/16 I [facility] The death certificate is for burial. The patient is a patient of Doctor Maneem. [facility].  On 09/28/16 I received the death certificate back from Doctor Maneem. I got the death certificate ready and called the funeral home to let them know the death certificate is ready for pickup. I also faxed a copy to the funeral home per the funeral home request.

## 2016-10-23 NOTE — Procedures (Signed)
Extubation Procedure Note  Patient Details:   Name: Chelsea Schaefer DOB: Sep 21, 1950 MRN: [id number]   Airway Documentation:  Patient extubated for withdrawal of life support per protocol and family request   Evaluation  O2 sats: stable throughout Complications: No apparent complications Patient did tolerate procedure well. Bilateral Breath Sounds: Rhonchi   No  Patient not able to speak, withdrawal of life support. RN at bedside.   [name], [name] 09/27/2016, 12:44 PM

## 2016-10-23 NOTE — Progress Notes (Signed)
Responded to consult for end of life. When asked nurse when pt would be transitioned to comfort care, she said soon, and I asked that she wait till I checked w/ family as to whether they wished a Catholic priest to come to administer the last Sacrament of the Sick to pt before that occurred.  Encountered family -- husband, son, daughter -- about to leave rm. Daughter was crying and could not speak (but hugged me), mainly the son then spoke to me, and husband appreciated my bow to him with hands in prayer pose and returned the gesture. Pt's son said their priest had come and prayed, and they did not need anything else now.  Before I entered, nurse had said the family wished to leave the room for care to be withdrawn. As they were leaving, pt's husband asked nurse to ensure that there were no visitors until pt passed -- and that they then be called to return when she had. Nurse will place No Visitors sign on pt's door and comply w/ their wishes. Chaplain available for f/u if desired.    Aug 06, 2016 1200  Clinical Encounter Type  Visited With Family;Health care provider  Visit Type Initial;Psychological support;Spiritual support;Social support;Patient actively dying  Referral From Nurse  Spiritual Encounters  Spiritual Needs Emotional;Grief support  Stress Factors  Patient Stress Factors Other (Comment) (Dying)  Family Stress Factors Loss   Ephraim Hamburgerynthia A Annaleigh Steinmeyer, [address]

## 2016-10-23 NOTE — Discharge Summary (Signed)
Physician Discharge Summary  Patient ID: Chelsea Schaefer MRN: [id number] DOB/AGE: July 27, 1950 66 y.o.  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 She was intubated for airway protection Over the course of the admission she remained unresponsive, comatose with some posturing intermittently. Also noted to have RUL infiltrate. She was treated for aspiration pneumonia with unasyn.   As she had worsening neurologic exam with posturing we discussed with the family and the decision was made to withdraw care. Neurology and neurosurgery were consulted and they agreed that she has poor prognosis.   Consults: Neurology, Neurosurgery  Disposition: 20-Expired   Allergies as of 09/22/2016   No Known Allergies     Medication List    You have not been prescribed any medications.     Signed: Draydon Clairmont 09/28/2016, 10:26 AM

## 2016-10-23 NOTE — Progress Notes (Signed)
Patient time of death 13:52, confirmed with Nelly Laurence, RN. Family and MD notified.   Bryley Kovacevic N. Jamelle Haring, RN

## 2016-10-23 NOTE — Progress Notes (Signed)
[facility] Rounding Note Subjective:   No issues overnight  Objective:  Vent Mode: PRVC FiO2 (%):  [30 %] 30 % Set Rate:  [15 bmp] 15 bmp Vt Set:  [440 mL] 440 mL PEEP:  [5 cmH20] 5 cmH20 Plateau Pressure:  [14 cmH20-23 cmH20] 23 cmH20  Temp:  [96.2 F (35.7 C)-98.7 F (37.1 C)] 97.3 F (36.3 C) (03/05 0822) Pulse Rate:  [77-99] 86 (03/05 0600) Resp:  [16-23] 19 (03/05 0600) BP: (138-188)/(62-88) 138/73 (03/05 0600) SpO2:  [91 %-100 %] 91 % (03/05 0856) FiO2 (%):  [30 %] 30 % (03/05 0856)  Physical Exam: Gen:      No acute distress HEENT:  EOMI, sclera anicteric Neck:     No masses; no thyromegaly Lungs:    Clear to auscultation bilaterally; normal respiratory effort CV:         Regular rate and rhythm; no murmurs Abd:      + bowel sounds; soft, non-tender; no palpable masses, no distension Ext:    No edema; adequate peripheral perfusion Skin:      Warm and dry; no rash Neuro: Unresponsive Skin:  Warm and dry   LINES/TUBES: OETT 7.5 2/23 >>> Foley 2/25 >>> OGT 2/23 >>> PIV>>>  CBC Latest Ref Rng & Units 10/20/16 09/25/2016 09/24/2016  WBC 4.0 - 10.5 K/uL 20.5(H) 21.9(H) 23.4(H)  Hemoglobin 12.0 - 15.0 g/dL 9.8(L) 10.2(L) 10.6(L)  Hematocrit 36.0 - 46.0 % 28.6(L) 30.1(L) 30.7(L)  Platelets 150 - 400 K/uL 224 211 181   BMP Latest Ref Rng & Units 10/20/16 09/25/2016 09/24/2016  Glucose 65 - 99 mg/dL 120(H) 105(H) 157(H)  BUN 6 - 20 mg/dL 29(H) 30(H) 23(H)  Creatinine 0.44 - 1.00 mg/dL 0.76 0.93 0.84  Sodium 135 - 145 mmol/L 140 137 137  Potassium 3.5 - 5.1 mmol/L 3.7 4.2 3.9  Chloride 101 - 111 mmol/L 110 107 104  CO2 22 - 32 mmol/L 23 21(L) 22  Calcium 8.9 - 10.3 mg/dL 8.3(L) 8.2(L) 8.2(L)   Hepatic Function Latest Ref Rng & Units October 20, 2016 09/25/2016 09/24/2016  Total Protein 6.5 - 8.1 g/dL - - -  Albumin 3.5 - 5.0 g/dL 1.5(L) 1.6(L) 1.7(L)  AST 15 - 41 U/L - - -  ALT 14 - 54 U/L - - -  Alk Phosphatase 38 - 126 U/L - - -  Total Bilirubin 0.3 -  1.2 mg/dL - - -   IMAGING/STUDIES: CT HEAD W/O 2/23:  Central pontine hematoma is essentially size stable from earlier today. Contiguous clot dissecting in the central midbrain and along the fourth ventricular floor is mildly increased. Patent fourth ventricle and no hydrocephalus. Chronic microvascular disease.  TTE 2/24: Mild LVH with normal cavity size, EF 65-70%, and grade 1 diastolic dysfunction. No aortic stenosis or regurgitation. Aortic root normal in size. No mitral stenosis or regurgitation. No pulmonic regurgitation or stenosis. Trivial tricuspid regurgitation without stenosis. No pericardial effusion.  CTA HEAD/NECK 2/25:  Negative CTA of the head and neck. No flow limiting or critical stenosis identified. Emphysema. Stable size of acute pontine hemorrhage with slightly increased localized vasogenic edema. There is mildly increased mass effect on the fourth ventricle posteriorly with slightly increased ventricular dilatation. No frank hydrocephalus. 3 mm left P com aneurysm.  CT HEAD W/O 2/26:  Size stable brainstem hematoma.  Stable normal ventricular volume.  PORT CXR 2/26:  Personally reviewed by me. Low lung volumes. No focal opacity appreciated. Endotracheal and enteric feeding tubes in good position.  MICROBIOLOGY: MRSA  PCR 2/23:  Negative  Blood Cultures x2 2/24 >>> Tracheal Aspirate Culture 2/24 >>>normal flora   ANTIBIOTICS: None.  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 Plan to start morphine today and terminal extubation with full comfort measures. Orders placed.   The patient is critically ill with multiple organ system failure and requires high complexity decision making for assessment and support,  frequent evaluation and titration of therapies, advanced monitoring, review of radiographic studies and interpretation of complex data.   Critical Care Time devoted to patient care services, exclusive of separately billable procedures, described in this note is 45 minutes.   Marshell Garfinkel MD [facility] Pager [phone] If no answer or after 3pm call: [phone] 10/14/2016, 10:07 AM

## 2016-10-23 NOTE — Progress Notes (Signed)
Pt's bracelets left on patients body after body prep done. Attempted to call son Caryn BeeKevin, but no answer. Per earlier conversation, pt's son states, " It is bad luck in our tradition to keep any thing of the one that passed."    Demarkus Remmel N. Jamelle HaringSnow, RN

## 2016-10-23 NOTE — Progress Notes (Signed)
200 ML morphine wasted in sink with Smith RobertErin Moore, RN.   Caty Tessler N. Jamelle HaringSnow, RN

## 2016-10-23 NOTE — Plan of Care (Signed)
Problem: Spiritual Needs Goal: Ability to function at adequate level Outcome: Not Progressing Pt for possible terminal extubation today.

## 2016-10-23 DEATH — deceased

## 2018-06-19 IMAGING — CT CT HEAD CODE STROKE
3 of 4 series · 17 of 47 positions shown, 20 images · non-contrast
Comparison: None.

CLINICAL DATA: Code stroke. 65-year-old female found unresponsive
in the bathroom this morning. Initial encounter.

EXAM:
CT HEAD WITHOUT CONTRAST
TECHNIQUE: Contiguous axial images were obtained from the base of the skull
through the vertex without intravenous contrast.

[Series 201: head w/o, idose (1) · axial · non-contrast · 0.40mm/px · z∈[+140,+265]mm · 11 of 31 slices shown, 14 images]
[im 3/31  brain]
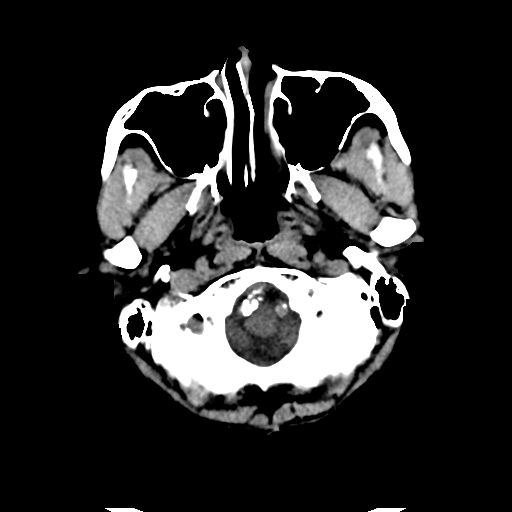
[im 3/31  bone]
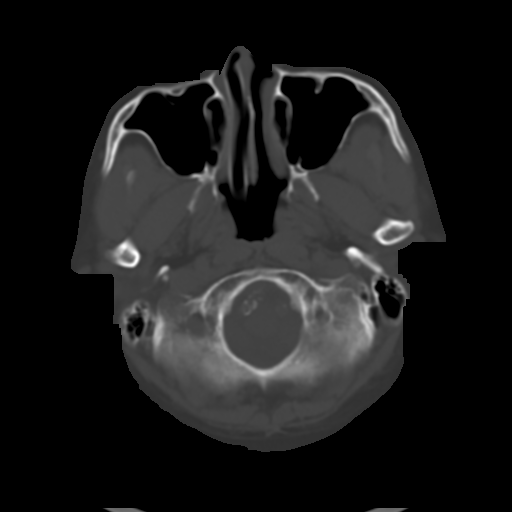
[im 5/31  brain]
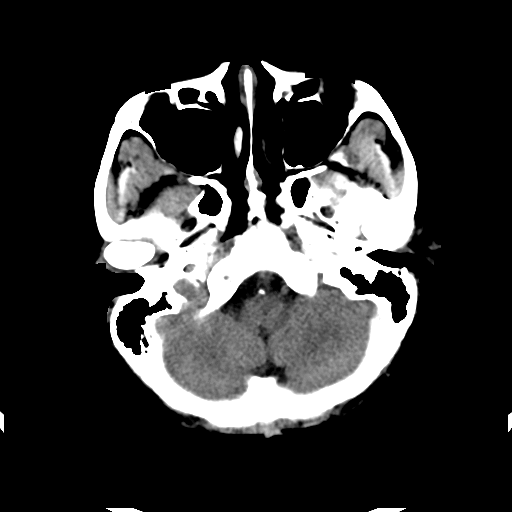
[im 7/31  brain]
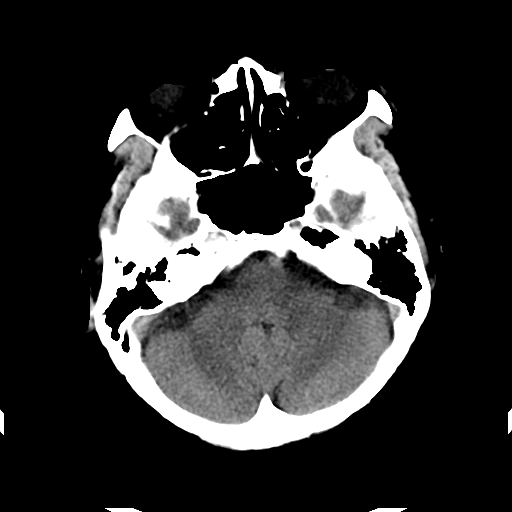
[im 11/31  brain]
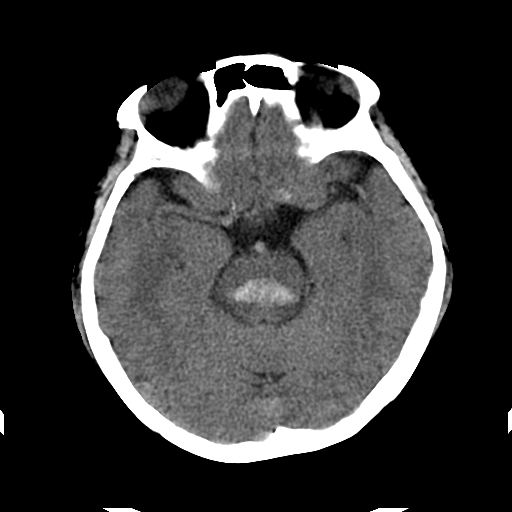
[im 13/31  brain]
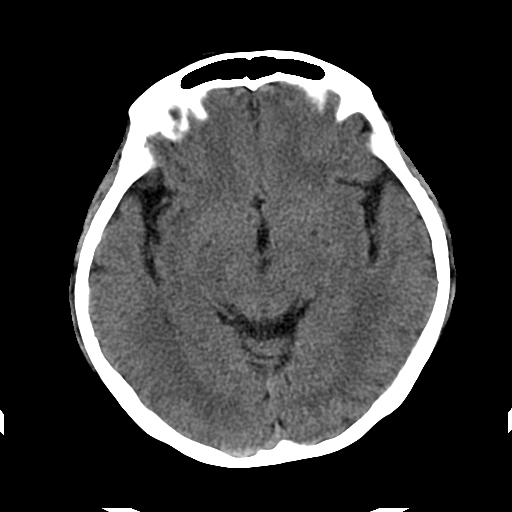
[im 13/31  bone]
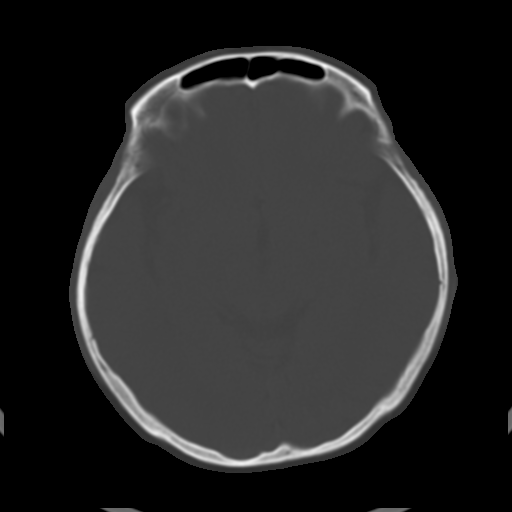
[im 16/31  brain]
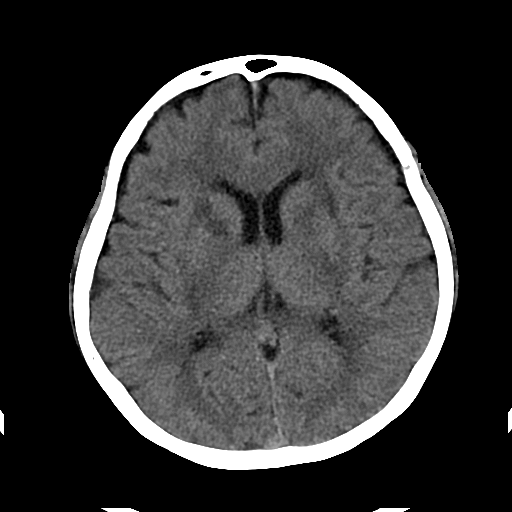
[im 18/31  brain]
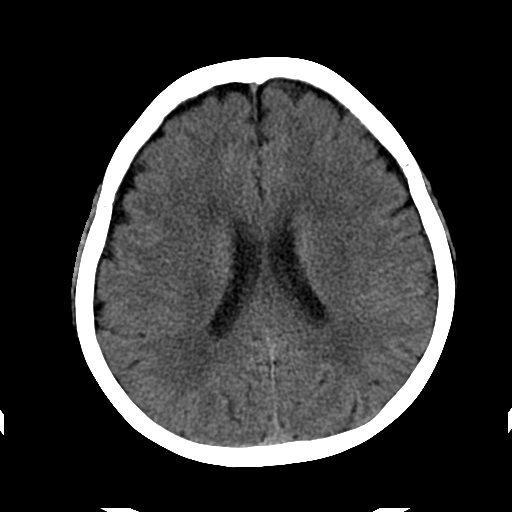
[im 20/31  brain]
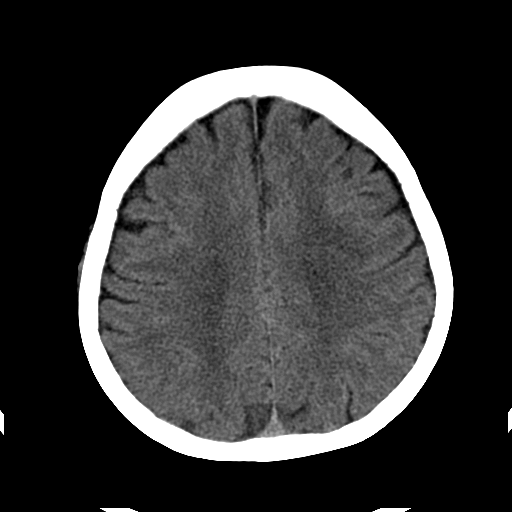
[im 24/31  brain]
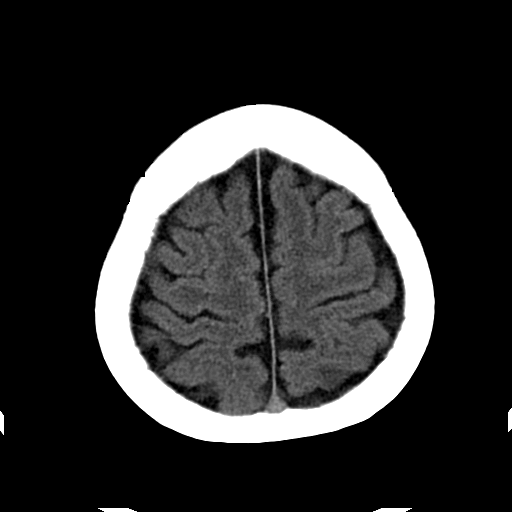
[im 24/31  bone]
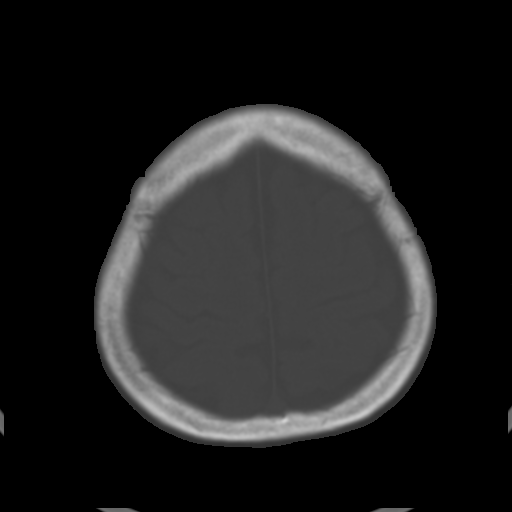
[im 26/31  brain]
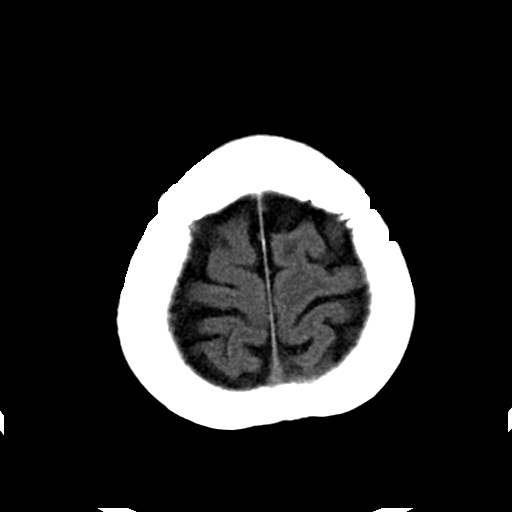
[im 28/31  brain]
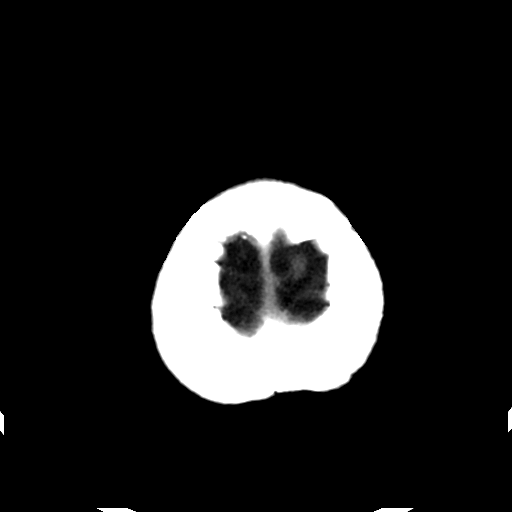

[Series 203: coronal st, idose (1) · coronal · 0.40mm/px · 3 of 66 slices shown]
[im 22/66  brain]
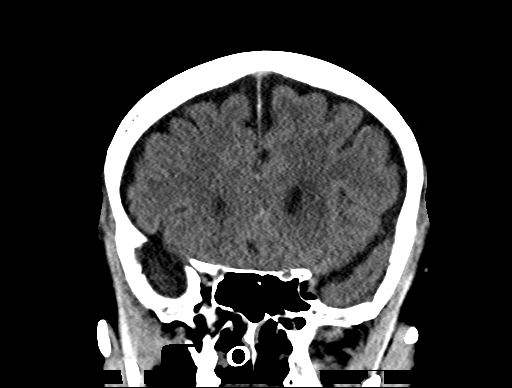
[im 29/66  brain]
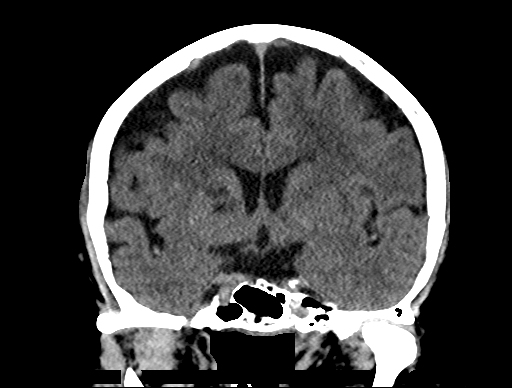
[im 37/66  brain]
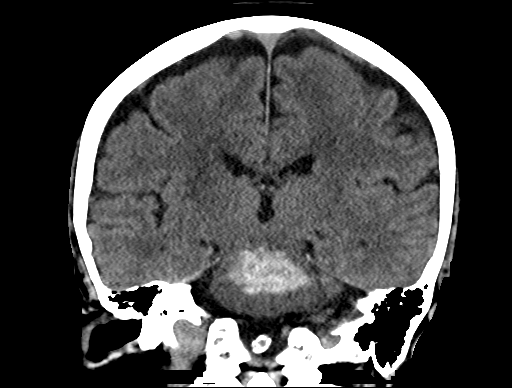

[Series 204: sagittal st, idose (1) · sagittal · 0.40mm/px · 3 of 68 slices shown]
[im 23/68  brain]
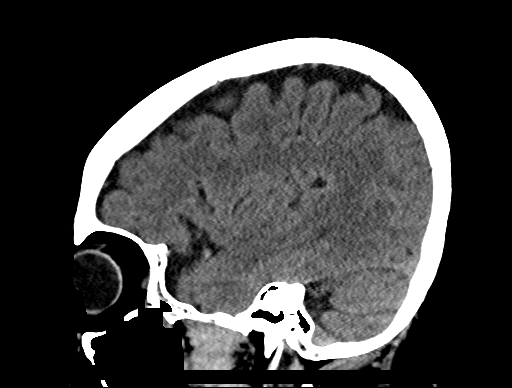
[im 34/68  brain]
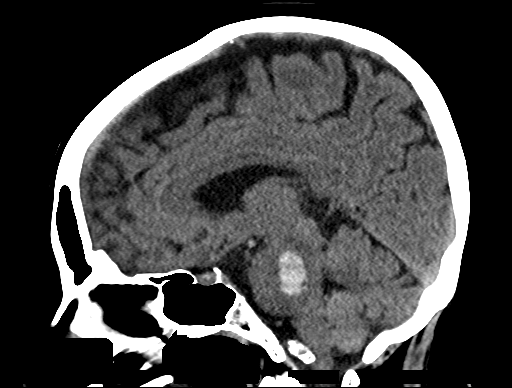
[im 45/68  brain]
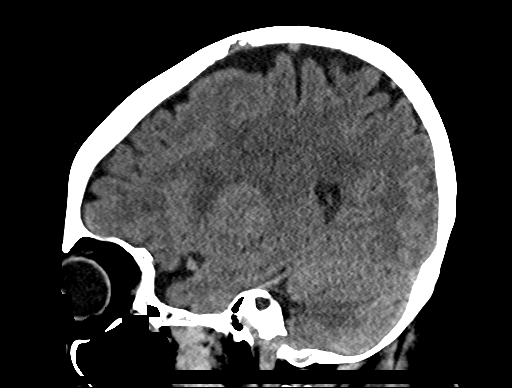

[17 of 47 positions shown; findings below may reference images not displayed]

FINDINGS: Brain: Oval hyperdense hemorrhage situated in the central to dorsal
pons with early extension into the left cerebellar peduncle and
dorsally toward the area postrema (series 21, image 8). The dominant
portion of hemorrhage encompasses 14 x 36 x 20 mm foreign estimated
blood volume of 5 mL. There is mild expansion of the pons. The
basilar cisterns remain patent. There is no intraventricular or
subarachnoid extension at this time.

Patchy and confluent bilateral cerebral white matter hypodensity,
including involvement of the deep white matter capsules and possibly
also the right basal ganglia. No cortical encephalomalacia. No
ventriculomegaly. No other acute intracranial hemorrhage. No
superimposed acute cortically based infarct.

Vascular: Extensive Calcified atherosclerosis at the skull base. No
suspicious intracranial vascular hyperdensity (basilar artery
density appears similar to the right ICA terminus series 21, image
11).

Skull: No acute osseous abnormality identified.

Sinuses/Orbits: Right nasal airway in place. Visualized paranasal
sinuses and mastoids are well pneumatized.

Other: Visualized orbits and scalp soft tissues are within normal
limits.

ASPECTS (Alberta Stroke Program Early CT Score)

Total score (0-10 with 10 being normal): Not applicable, acute
intracranial hemorrhage.
IMPRESSION: 1. Acute central pontine hemorrhage with estimated blood volume of 5
mL. Early extension into the left cerebellar peduncle and area
postrema. No intraventricular or subarachnoid extension. Patent
basilar cisterns. No significant intracranial mass effect at this
time.
2. Underlying chronic small vessel disease.
3. ASPECTS is not applicable; acute hemorrhage.
4. The above was relayed via text pager to Dr. [employer] on 09/16/2016
